# Patient Record
Sex: Male | Born: 1950
Health system: Southern US, Community
[De-identification: ages and names within clinical notes are randomized; demographics above are authoritative.]

## PROBLEM LIST (undated history)

## (undated) DIAGNOSIS — I493 Ventricular premature depolarization: Secondary | ICD-10-CM

## (undated) DIAGNOSIS — E785 Hyperlipidemia, unspecified: Secondary | ICD-10-CM

## (undated) DIAGNOSIS — F419 Anxiety disorder, unspecified: Secondary | ICD-10-CM

## (undated) DIAGNOSIS — I4891 Unspecified atrial fibrillation: Secondary | ICD-10-CM

## (undated) DIAGNOSIS — I10 Essential (primary) hypertension: Secondary | ICD-10-CM

## (undated) HISTORY — DX: Hyperlipidemia, unspecified: E78.5

## (undated) HISTORY — DX: Anxiety disorder, unspecified: F41.9

## (undated) HISTORY — DX: Essential (primary) hypertension: I10

## (undated) HISTORY — DX: Ventricular premature depolarization: I49.3

## (undated) HISTORY — DX: Unspecified atrial fibrillation: I48.91

## (undated) HISTORY — PX: OTHER SURGICAL HISTORY: SHX169

---

## 2003-05-05 ENCOUNTER — Encounter: Admission: RE | Admit: 2003-05-05 | Discharge: 2003-06-01 | Payer: Self-pay | Admitting: *Deleted

## 2006-07-29 HISTORY — PX: CARDIOVERSION: SHX1299

## 2007-01-30 ENCOUNTER — Inpatient Hospital Stay (HOSPITAL_COMMUNITY): Admission: EM | Admit: 2007-01-30 | Discharge: 2007-02-02 | Payer: Self-pay | Admitting: Emergency Medicine

## 2007-02-02 ENCOUNTER — Encounter: Payer: Self-pay | Admitting: Cardiovascular Disease

## 2010-03-23 ENCOUNTER — Ambulatory Visit: Payer: Self-pay | Admitting: Cardiovascular Disease

## 2010-05-14 ENCOUNTER — Ambulatory Visit: Payer: Self-pay | Admitting: Cardiology

## 2010-09-03 ENCOUNTER — Ambulatory Visit (INDEPENDENT_AMBULATORY_CARE_PROVIDER_SITE_OTHER): Payer: BC Managed Care – PPO | Admitting: Cardiovascular Disease

## 2010-09-03 ENCOUNTER — Encounter: Payer: Self-pay | Admitting: Cardiovascular Disease

## 2010-09-03 DIAGNOSIS — I4819 Other persistent atrial fibrillation: Secondary | ICD-10-CM | POA: Insufficient documentation

## 2010-09-03 DIAGNOSIS — F411 Generalized anxiety disorder: Secondary | ICD-10-CM

## 2010-09-03 DIAGNOSIS — I10 Essential (primary) hypertension: Secondary | ICD-10-CM

## 2010-09-03 DIAGNOSIS — E785 Hyperlipidemia, unspecified: Secondary | ICD-10-CM | POA: Insufficient documentation

## 2010-09-03 DIAGNOSIS — I493 Ventricular premature depolarization: Secondary | ICD-10-CM | POA: Insufficient documentation

## 2010-09-03 DIAGNOSIS — F419 Anxiety disorder, unspecified: Secondary | ICD-10-CM

## 2010-09-03 DIAGNOSIS — I4949 Other premature depolarization: Secondary | ICD-10-CM

## 2010-09-03 DIAGNOSIS — I4891 Unspecified atrial fibrillation: Secondary | ICD-10-CM

## 2010-09-03 MED ORDER — RAMIPRIL 10 MG PO CAPS: 10.0000 mg | ORAL_CAPSULE | Freq: Every day | ORAL | Status: DC

## 2010-09-03 MED ORDER — NEBIVOLOL HCL 10 MG PO TABS: 10.0000 mg | ORAL_TABLET | Freq: Every day | ORAL | Status: DC

## 2010-09-03 NOTE — Progress Notes (Signed)
Patient Active Problem List  Diagnoses  . HTN (hypertension)  . A-fib  . PVC (premature ventricular contraction)  . Hyperlipidemia  . Anxiety   See medication list, social history, family history and review of systems.  Gregory Hill is a middle-aged gentleman with the above-noted medical history. He presents today with several problems including hypertension and anxiety. His blood pressure has Been a little higher for the past several weeks. He notes that he is not sleeping very well. He is not eating any extra salt. He has taken an extra Altace each day of the weekend and has increased his HCTZ to 12.5 mg a day up from 12.5 mg every other day. He denies any chest pain, shortness of breath, syncope, or presyncope. He is walking about 1 mile a day. This is a little bit less than he typically walks.  On exam he is a middle-age gentleman in no acute distress. He is alert and oriented x3 and his mood and affect are normal. His blood pressure is 150/70. His heart rate is 72. His HEENT exam reveals normal sclera. His mucous membranes are moist. His carotids are 2+ and are without bruits. There is no thyromegaly. There is no JVD. His lungs are clear. Heart regular rate S1-S2. Abdominal exam reveals good bowel sounds and has no hepatosplenomegaly. There are no areas of tenderness. His extremities have no clubbing cyanosis or edema. There no palpable cords. His exam is normal. His gait is normal.  Gregory Hill presents with some mild hypertension. He is slightly anxious about this as well. At this point we will start him on Bystolic 5 mg a day. We will increase this to 10 mg a day after a week or 2. He is to discontinue the Toprol. We will have him increase his HCTZ  To 1/2 a tablet every day. We will increase his Altace to 10 mg a day. I'll see him back in the office in several weeks for followup office visit.  Check a basic metabolic profile today.  He has a history of intermittent atrial fibrillation in the past. This  is not been an active problem.  He does describe some palpitations which I suspect were premature ventricular contractions. We will need to keep a close eye on this and he may need to wear an event monitor.

## 2010-09-06 NOTE — Assessment & Plan Note (Signed)
[facility]                                ON-CALL NOTE  Gregory Hill, Gregory Hill                         MRN:          [id number] DATE:09/01/2010                            DOB:          05/16/1951   PRIMARY CARDIOLOGIST:  Vesta Mixer, M.D.  The patient called concerned about recently elevated blood pressure readings, in the 160-170 systolic range.  He states that he contacted Dr. Harvie Bridge office, and was instructed to double his dose of Altace, to 10 mg daily.  He denies any associated chest pain, shortness of breath, blurred vision, or headache.  His blood pressure earlier today was 162 systolic.  He was calling for additional instructions.  Of note, he is scheduled to see Dr. Elease Hashimoto in the clinic, this Monday.  PLAN:  I advised the patient to continue a course of careful monitoring for any development of symptoms, as outlined above.  I suggested that rather making additional adjustment at this point in time, given that he is scheduled to see Dr. Elease Hashimoto in 2 days, I will defer to Dr. Elease Hashimoto as to additional adjustments of his antihypertensive regimen.  The patient was agreeable with this recommendation, and knows to contact us again in the event of any worsening symptoms or worsening of blood pressure readings.     [name], PA-C      [name], MD, [facility]   GS/MedQ  DD: 09/01/2010  DT: 09/02/2010  Job #: [id number]

## 2010-09-19 ENCOUNTER — Encounter: Payer: Self-pay | Admitting: Cardiovascular Disease

## 2010-09-19 ENCOUNTER — Ambulatory Visit (INDEPENDENT_AMBULATORY_CARE_PROVIDER_SITE_OTHER): Payer: BC Managed Care – PPO | Admitting: Cardiovascular Disease

## 2010-09-19 DIAGNOSIS — I493 Ventricular premature depolarization: Secondary | ICD-10-CM

## 2010-09-19 DIAGNOSIS — I119 Hypertensive heart disease without heart failure: Secondary | ICD-10-CM

## 2010-09-19 DIAGNOSIS — I10 Essential (primary) hypertension: Secondary | ICD-10-CM

## 2010-09-19 NOTE — Assessment & Plan Note (Signed)
Gregory Hill still has occasional palpitations which I suspect her PVCs. I've reassured him that these are normal.

## 2010-09-19 NOTE — Progress Notes (Signed)
Patient Active Problem List  Diagnoses  . HTN (hypertension)  . A-fib  . PVC (premature ventricular contraction)  . Hyperlipidemia  . Anxiety    Current Outpatient Prescriptions on File Prior to Visit  Medication Sig Dispense Refill  . aspirin 81 MG tablet Take 81 mg by mouth daily.        . fenofibrate (TRICOR) 145 MG tablet Take 145 mg by mouth daily.        . hydrochlorothiazide 25 MG tablet Take 25 mg by mouth daily. 1/2 tablet a day       . niacin (NIASPAN) 1000 MG CR tablet Take 1,000 mg by mouth at bedtime. 1/2 tablet a day       . omega-3 acid ethyl esters (LOVAZA) 1 G capsule Take 2 g by mouth 2 (two) times daily.        . rosuvastatin (CRESTOR) 10 MG tablet Take 10 mg by mouth daily.        . nebivolol (BYSTOLIC) 10 MG tablet Take 1 tablet (10 mg total) by mouth daily.  30 tablet  11  . ramipril (ALTACE) 10 MG capsule Take 1 capsule (10 mg total) by mouth daily.  30 capsule  11   Gregory Hill presents today for followup of his hypertension. He has been feeling fairly well. He denies any chest pain or shortness of breath.  The patient is alert and oriented x 3.  The mood and affect are normal. The HEENT exam reveals that the sclera are nonicteric.  The mucous membranes are moist.  The carotids are 2+ without bruits.  There is no thyromegaly.  There is no JVD.  The lungs are clear.  The chest wall is non tender.  The heart exam reveals a regular rate with a normal S1 and S2.  There are no murmurs, gallops, or rubs.  The PMI is not displaced.   Abdominal exam reveals good bowel sounds.  There is no guarding or rebound.  There is no hepatosplenomegaly or tenderness.  There are no masses.  Exam of the legs reveal no clubbing, cyanosis, or edema.  The legs are without rashes.  The distal pulses are intact.  Cranial nerves II - XII are intact.  Motor and sensory functions are intact.  The gait is normal.  Gregory Hill is doing very well from a cardiac standpoint. I've reassured him that his blood  pressure and heart rate levels are normal. We will continue with the Altace 10 mg twice a day. I'll see him again in 3 months for followup visit.

## 2010-09-19 NOTE — Assessment & Plan Note (Signed)
His blood pressure is well controlled. We will increase his Altace to 10 mg twice a day which he actually already started taking. We will followup with him in 3 months.

## 2010-09-19 NOTE — Patient Instructions (Signed)
I'll see him again in 3 months.

## 2010-09-25 ENCOUNTER — Ambulatory Visit: Payer: Self-pay | Admitting: Cardiovascular Disease

## 2010-09-26 ENCOUNTER — Encounter: Payer: Self-pay | Admitting: Cardiovascular Disease

## 2010-09-26 NOTE — Progress Notes (Unsigned)
Patient Active Problem List  Diagnoses  . HTN (hypertension)  . A-fib  . PVC (premature ventricular contraction)  . Hyperlipidemia  . Anxiety    Current Outpatient Prescriptions on File Prior to Visit  Medication Sig Dispense Refill  . aspirin 81 MG tablet Take 81 mg by mouth daily.        . fenofibrate (TRICOR) 145 MG tablet Take 145 mg by mouth daily.        . hydrochlorothiazide 25 MG tablet Take 25 mg by mouth daily. 1/2 tablet a day       . nebivolol (BYSTOLIC) 10 MG tablet Take 1 tablet (10 mg total) by mouth daily.  30 tablet  11  . niacin (NIASPAN) 1000 MG CR tablet Take 1,000 mg by mouth at bedtime. 1/2 tablet a day       . omega-3 acid ethyl esters (LOVAZA) 1 G capsule Take 2 g by mouth 2 (two) times daily.        . ramipril (ALTACE) 10 MG capsule Take 1 capsule (10 mg total) by mouth daily.  30 capsule  11  . rosuvastatin (CRESTOR) 10 MG tablet Take 10 mg by mouth daily.         Gregory Hill presents today for followup of his hypertension. He has been feeling fairly well. He denies any chest pain or shortness of breath.  The patient is alert and oriented x 3.  The mood and affect are normal. The HEENT exam reveals that the sclera are nonicteric.  The mucous membranes are moist.  The carotids are 2+ without bruits.  There is no thyromegaly.  There is no JVD.  The lungs are clear.  The chest wall is non tender.  The heart exam reveals a regular rate with a normal S1 and S2.  There are no murmurs, gallops, or rubs.  The PMI is not displaced.   Abdominal exam reveals good bowel sounds.  There is no guarding or rebound.  There is no hepatosplenomegaly or tenderness.  There are no masses.  Exam of the legs reveal no clubbing, cyanosis, or edema.  The legs are without rashes.  The distal pulses are intact.  Cranial nerves II - XII are intact.  Motor and sensory functions are intact.  The gait is normal.  Gregory Hill is doing very well from a cardiac standpoint. I've reassured him that his blood  pressure and heart rate levels are normal. We will continue with the Altace 10 mg twice a day. I'll see him again in 3 months for followup visit.

## 2010-10-17 ENCOUNTER — Telehealth: Payer: Self-pay | Admitting: *Deleted

## 2010-10-17 DIAGNOSIS — I10 Essential (primary) hypertension: Secondary | ICD-10-CM

## 2010-10-17 MED ORDER — RAMIPRIL 10 MG PO CAPS: 10.0000 mg | ORAL_CAPSULE | Freq: Two times a day (BID) | ORAL | Status: DC

## 2010-10-17 NOTE — Telephone Encounter (Signed)
Called pt to reassess  his blood pressure.  States he is feeling well.   BP was noted 138/80.  Will continue to monitor.  He requested his Altace to be faxed eto [facility].  This was done.

## 2010-10-22 ENCOUNTER — Other Ambulatory Visit: Payer: Self-pay | Admitting: *Deleted

## 2010-10-22 DIAGNOSIS — I10 Essential (primary) hypertension: Secondary | ICD-10-CM

## 2010-10-22 MED ORDER — RAMIPRIL 10 MG PO CAPS: 10.0000 mg | ORAL_CAPSULE | Freq: Two times a day (BID) | ORAL | Status: DC

## 2010-10-22 MED ORDER — RAMIPRIL 10 MG PO CAPS: 10.0000 mg | ORAL_CAPSULE | Freq: Every day | ORAL | Status: DC

## 2010-10-22 NOTE — Telephone Encounter (Signed)
Pt requesting refill on  Altace

## 2010-11-15 ENCOUNTER — Ambulatory Visit (INDEPENDENT_AMBULATORY_CARE_PROVIDER_SITE_OTHER): Payer: BC Managed Care – PPO | Admitting: Cardiovascular Disease

## 2010-11-15 ENCOUNTER — Telehealth: Payer: Self-pay | Admitting: Cardiovascular Disease

## 2010-11-15 ENCOUNTER — Encounter: Payer: Self-pay | Admitting: *Deleted

## 2010-11-15 ENCOUNTER — Encounter: Payer: Self-pay | Admitting: Cardiovascular Disease

## 2010-11-15 VITALS — BP 120/70 | HR 56 | Wt 186.0 lb

## 2010-11-15 DIAGNOSIS — E785 Hyperlipidemia, unspecified: Secondary | ICD-10-CM

## 2010-11-15 DIAGNOSIS — I10 Essential (primary) hypertension: Secondary | ICD-10-CM

## 2010-11-15 NOTE — Telephone Encounter (Signed)
Pt calling with concerns of his HR being too low.  States it is ranging from 50-60;  States he feels dizzy in the head at times.  BP is 135/70;  Per Dr. Elease Hashimoto:  Hold the Metoprolol 12.5mg  daily and continue to monitor HR and BP;  Pt verbalized an understanding and will call back if he needs an appt.

## 2010-11-15 NOTE — Assessment & Plan Note (Signed)
His blood pressure is very well controlled this afternoon. It was a little bit elevated this morning. I've asked him to continue to hold his metoprolol for next 5 days. He will call back and let us know is doing. We will have him restart his Toprol at 12.5 mg a day if he has worsening PVCs were his blood pressure is higher.  I'll see him in 3 months.

## 2010-11-15 NOTE — Assessment & Plan Note (Signed)
He has a low HDL. I've tried to hurt him to take Niaspan 1000 mg a day instead of the 500 mg a day. He has trouble with flushing but he states that he'll try the higher dose again.  We'll check a fasting lipid profile and I see him again

## 2010-11-15 NOTE — Progress Notes (Signed)
Gregory Hill Date of Birth  [birth year] [facility] [address]     [address] [place], [place]  [address] [phone]  Fax  [phone]  History of Present Illness:  Gregory Hill is a middle-aged gentleman with a history of hypertension and intermittent atrial fibrillation. He also has a history of PVCs, dyslipidemia with a low HDL, and anxiety.  He presents today as a walk in visit for some episodes of hypertension. He also has been having some fatigue for the past week or so. He held his metoprolol this morning.  Current Outpatient Prescriptions on File Prior to Visit  Medication Sig Dispense Refill  . aspirin 81 MG tablet Take 81 mg by mouth daily.        . fenofibrate (TRICOR) 145 MG tablet Take 145 mg by mouth daily.        . hydrochlorothiazide 25 MG tablet Take 25 mg by mouth daily. 1/2 tablet a day       . niacin (NIASPAN) 1000 MG CR tablet Take 1,000 mg by mouth at bedtime. 1/2 tablet a day       . omega-3 acid ethyl esters (LOVAZA) 1 G capsule Take 2 g by mouth 2 (two) times daily.        . rosuvastatin (CRESTOR) 10 MG tablet Take 10 mg by mouth daily.        Marland Kitchen DISCONTD: ramipril (ALTACE) 10 MG capsule Take 1 capsule (10 mg total) by mouth daily.  30 capsule  11  . nebivolol (BYSTOLIC) 10 MG tablet Take 1 tablet (10 mg total) by mouth daily.  30 tablet  11  . ramipril (ALTACE) 10 MG capsule Take 1 capsule (10 mg total) by mouth 2 (two) times daily.  180 capsule  3   No Known Allergies  Past Medical History  Diagnosis Date  . Hypertension   . Hyperlipidemia   . Anxiety   . Arrhythmia     afib    No past surgical history on file.  History  Smoking status  . Never Smoker   Smokeless tobacco  . Not on file    History  Alcohol Use  . 1.5 - 2.5 oz/week  . 3-5 drink(s) per week    Family History  Problem Relation Age of Onset  . Heart disease Mother   . Heart attack Mother     Reviw of Systems:  Reviewed in the HPI.   All other systems are negative.  Physical Exam: BP 120/70  Pulse 56  Wt 186 lb (84.369 kg) The patient is alert and oriented x 3.  The mood and affect are normal.  The skin is warm and dry.  Color is normal.  The HEENT exam reveals that the sclera are nonicteric.  The mucous membranes are moist.  The carotids are 2+ without bruits.  There is no thyromegaly.  There is no JVD.  The lungs are clear.  The chest wall is non tender.  The heart exam reveals a regular rate with a normal S1 and S2.  There are no murmurs, gallops, or rubs.  The PMI is not displaced.   Abdominal exam reveals good bowel sounds.  There is no guarding or rebound.  There is no hepatosplenomegaly or tenderness.  There are no masses.  Exam of the legs reveal no clubbing, cyanosis, or edema.  The legs are without rashes.  The distal pulses are intact.  Cranial nerves II - XII are intact.  Motor  and sensory functions are intact.  The gait is normal.  Assessment / Plan:

## 2010-11-15 NOTE — Telephone Encounter (Signed)
RETURNING YOUR CALL, PLS CALL BACK

## 2010-11-27 ENCOUNTER — Other Ambulatory Visit: Payer: Self-pay | Admitting: Family Medicine

## 2010-11-27 ENCOUNTER — Ambulatory Visit
Admission: RE | Admit: 2010-11-27 | Discharge: 2010-11-27 | Disposition: A | Discharge status: Home / Self Care | Payer: BC Managed Care – PPO | Source: Ambulatory Visit | Attending: Family Medicine | Admitting: Family Medicine

## 2010-11-27 MED ORDER — IOHEXOL 300 MG/ML  SOLN
100.0000 mL | Freq: Once | INTRAMUSCULAR | Status: AC | PRN
  Administered 2010-11-27: 100 mL via INTRAVENOUS

## 2010-12-11 NOTE — Discharge Summary (Signed)
NAMEBONIFACE, GOFFE NO.:  [id number]   MEDICAL RECORD NO.:  [id number]          PATIENT TYPE:  INP   LOCATION:  2035                         FACILITY:  [facility]   PHYSICIAN:  Vesta Mixer, M.D. DATE OF BIRTH:  Sep 14, 1950   DATE OF ADMISSION:  01/30/2007  DATE OF DISCHARGE:  02/02/2007                               DISCHARGE SUMMARY   DISCHARGE DIAGNOSES:  1. Atrial fibrillation with a controlled ventricular response - status      post cardioversion.  2. Anticoagulation.  3. Profound vagal response leading to asystole, requiring a brief      period of CPR/  4. Dyslipidemia.  5. Hypertension.   DISCHARGE MEDICATIONS:  1. Coumadin 5 mg a day.  He will have his pro time checked in our      office on Wednesday.  2. Toprol-XL 50 mg a day.  3. Aspirin 81 mg a day.  4. TriCor 145 mg a day.  5. Niaspan 500 mg a day.  6. Omega-3 fatty acid 3 times a day.  7. Crestor 10 mg a day.  8. Prilosec 20 mg over-the-counter once a day.  9. Altace 5 mg a day.  10.HCTZ 12.5 mg 3 times a day.   DISPOSITION:  Patient will have his pro time checked on Wednesday.  He  will see Dr. Elease Hashimoto next week.   HISTORY:  Mr. Thorpe is a 60 year old gentleman with a history of mild  hypercholesterolemia and mild hypertension.  He felt atrial fibrillation  and presented to the emergency room.  Please see dictated H&P for  further details.   HOSPITAL COURSE:  1. Atrial fibrillation.  Patient was seen in the emergency room.  We      placed him on a low dose Cardizem drip.  He had an episode of      asystole following placement of an IV.  He received a short course      of CPR and came back without any neurologic deficits.  He ruled out      for myocardial infarction and had no other sequela.  He stays in      atrial fibrillation.  He was started on heparin and Coumadin.  On      February 02, 2007, he underwent a TEE, which revealed no evidence of      thrombus.  His left atrium and left  atrial appendage were without      clot.  He underwent a successful cardioversion with resumption of      sinus rhythm.  He now feels well and is not having any neurologic      deficits.  His INR today is 2.0.  We will send him on Coumadin 5 mg      today and we will recheck his pro time again the following day.  2. Dyslipidemia.  The patient will continue with the TriCor, Niaspan      and the Crestor.  3. Hypertension.  The patient will continue with the Altace and HCTZ.      He will also continue  on metoprolol XL for his blood pressure and      for his atrial fibrillation.  4. Other medical problems.  Stable and will be addressed as an      outpatient.           ______________________________  Vesta Mixer, M.D.     PJN/MEDQ  D:  02/02/2007  T:  02/02/2007  Job:  [id number]

## 2010-12-11 NOTE — H&P (Signed)
NAMEMarland Kitchen  TIMMY, BUBECK NO.:  [id number]   MEDICAL RECORD NO.:  [id number]          PATIENT TYPE:  EMS   LOCATION:  MAJO                         FACILITY:  [facility]   PHYSICIAN:  Vesta Mixer, M.D. DATE OF BIRTH:  10/22/1950   DATE OF ADMISSION:  01/30/2007  DATE OF DISCHARGE:                              HISTORY & PHYSICAL   Gregory Hill is a 60 year old gentleman with a history of  hyperlipidemia, hypertension.  He is admitted with new onset atrial  fibrillation.   Gregory Hill has been in relatively good health.  He has had a long history of  hypertension and mild hyperlipidemia.  These been very well-controlled.  He is a very active person.  He has a very strong family history of  early death in all the male members of his family.  In fact, no male  member of his family has lived past age 87.   He was down at the beach this past week and developed tachy  palpitations.  He has been taking extra doses of metoprolol.  This has  slowed his ventricular response but it has not converted him back to  normal.  He went to the EMS station and was found to have atrial  fibrillation.  He came home from the beach yesterday and now presents to  the emergency room.  He is still in atrial fibrillation.  He denies any  chest pain, shortness breath.  He does have palpitations and states that  he is more fatigued than usual.  He denies any PND or orthopnea.  He  denies any rash or fever or chills.   CURRENT MEDICATIONS:  1. Altace 5 mg a day.  2. Metoprolol 25 mg p.o. b.i.d.  3. Enteric-coated aspirin 325 mg a day.  4. Crestor 10 mg a day.  5. TriCor 145 mg a day.  6. Niaspan 500 mg a day.  7. Lovaza 1 gram a day.   ALLERGIES:  There are no known drug allergies.   PAST MEDICAL HISTORY:  1. Hypertension.  2. Hyperlipidemia.   SOCIAL HISTORY:  The patient does not smoke.  He drinks alcohol only  rarely.   FAMILY HISTORY:  Strongly positive for early cardiac death,  especially  in the male members of the family.   REVIEW OF SYSTEMS:  His review of systems is reviewed and is essentially  negative except as noted in the HPI.   PHYSICAL EXAMINATION:  GENERAL:  He is a middle aged gentleman in no  acute distress.  He is alert, oriented times 3.  His mood and affect are  normal.  VITAL SIGNS:  His blood pressure is 127/86 with a heart rate of 94  initially.  HEENT:  Examination reveals 2+ carotids.  He has no bruit, no JVD, no  thyromegaly.  LUNGS:  Clear to auscultation.  HEART:  Irregularly irregular.  ABDOMEN:  Examination reveals good bowel sounds and is nontender.  EXTREMITIES:  He has no clubbing, cyanosis or edema.  NEUROLOGIC:  Examination is nonfocal.   DATA REVIEWED:  His EKG reveals  atrial fibrillation with a controlled  ventricular response.  His laboratory data is pending.   ASSESSMENT:  Gregory Hill presents with new onset atrial fibrillation.  We will  try to convert him using IV Cardizem.  He tried multiple low doses of  p.o. metoprolol.  We will admit him to the hospital and start him on  heparin and Coumadin.  He is 60 years old so he should be at relatively  low risk for thromboembolus, although he does have multiple risk factors  and a family history.  He also is having some anxiety regarding this  atrial fibrillation.  We will give him some Xanax as needed.  We will  admit him for at least 23 hours to see how he does.           ______________________________  Vesta Mixer, M.D.     PJN/MEDQ  D:  01/30/2007  T:  01/30/2007  Job:  [id number]

## 2010-12-18 ENCOUNTER — Ambulatory Visit: Payer: BC Managed Care – PPO | Admitting: Cardiovascular Disease

## 2010-12-18 ENCOUNTER — Other Ambulatory Visit: Payer: BC Managed Care – PPO | Admitting: *Deleted

## 2011-01-17 ENCOUNTER — Other Ambulatory Visit: Payer: Self-pay | Admitting: *Deleted

## 2011-01-17 MED ORDER — NIACIN ER (ANTIHYPERLIPIDEMIC) 1000 MG PO TBCR: EXTENDED_RELEASE_TABLET | ORAL | Status: DC

## 2011-01-17 NOTE — Telephone Encounter (Signed)
Fax received from pharmacy. Refill completed. Jodette Jacoria Keiffer RN  

## 2011-01-21 ENCOUNTER — Telehealth: Payer: Self-pay | Admitting: Cardiovascular Disease

## 2011-01-21 NOTE — Telephone Encounter (Signed)
Pt called he has question about his meds please call

## 2011-01-21 NOTE — Telephone Encounter (Signed)
Dr consulted/pt can take aleve or anything else for back pain, pt informed.

## 2011-01-24 ENCOUNTER — Telehealth: Payer: Self-pay | Admitting: Cardiovascular Disease

## 2011-01-24 NOTE — Telephone Encounter (Signed)
Hurt his back and the Doctors at [facility] want to put him on Predmazone starting to day and he wanted to know if that would be ok. Please call back.

## 2011-01-24 NOTE — Telephone Encounter (Signed)
Pt not diabetic and isnt on any meds that would prohibit him from taking predisone, pt informed.

## 2011-02-01 ENCOUNTER — Telehealth: Payer: Self-pay | Admitting: Cardiovascular Disease

## 2011-02-01 ENCOUNTER — Other Ambulatory Visit: Payer: Self-pay | Admitting: *Deleted

## 2011-02-01 ENCOUNTER — Telehealth: Payer: Self-pay | Admitting: *Deleted

## 2011-02-01 NOTE — Telephone Encounter (Signed)
Called about his prescription of methocarbam and was concerned about it not mixing well with his blood pressure medication Altase. Please call back. I have pulled his chart.

## 2011-02-01 NOTE — Telephone Encounter (Signed)
Pt has new scripts and is concerned with reaction, spoke with pt and he spoke with the prescribing dr and with his pharmacist, the muscle relaxant may increase risk of hypotension and pt informed to get up slowly,  i informed dr Ian [place] of pts concerns and is aware of the risk of hypotension and to monitor s/s, stay hydrated. Pt informed and verbalized understanding.Alfonso Ramus RN

## 2011-02-01 NOTE — Telephone Encounter (Signed)
Wondered drug reaction between altace and meloxicam, dr Ian [place] consulted and he can use the med short term, pt to use for 30 days and then stop. Pt aware it is short term drug only. Alfonso Ramus RN

## 2011-02-12 ENCOUNTER — Other Ambulatory Visit: Payer: Self-pay | Admitting: Internal Medicine

## 2011-02-18 ENCOUNTER — Other Ambulatory Visit (INDEPENDENT_AMBULATORY_CARE_PROVIDER_SITE_OTHER): Payer: BC Managed Care – PPO | Admitting: *Deleted

## 2011-02-18 ENCOUNTER — Other Ambulatory Visit: Payer: BC Managed Care – PPO | Admitting: *Deleted

## 2011-02-18 DIAGNOSIS — R5383 Other fatigue: Secondary | ICD-10-CM

## 2011-02-18 DIAGNOSIS — R39198 Other difficulties with micturition: Secondary | ICD-10-CM

## 2011-02-18 DIAGNOSIS — E785 Hyperlipidemia, unspecified: Secondary | ICD-10-CM

## 2011-02-18 DIAGNOSIS — R5381 Other malaise: Secondary | ICD-10-CM

## 2011-02-18 LAB — BASIC METABOLIC PANEL
BUN: 16 mg/dL (ref 6–23)
CO2: 26 mEq/L (ref 19–32)
Calcium: 9.3 mg/dL (ref 8.4–10.5)
Creatinine, Ser: 1 mg/dL (ref 0.4–1.5)

## 2011-02-18 LAB — CBC WITH DIFFERENTIAL/PLATELET
Basophils Relative: 0.7 % (ref 0.0–3.0)
Eosinophils Relative: 2 % (ref 0.0–5.0)
Hemoglobin: 12.7 g/dL — ABNORMAL LOW (ref 13.0–17.0)
Lymphocytes Relative: 33.6 % (ref 12.0–46.0)
MCHC: 34 g/dL (ref 30.0–36.0)
Monocytes Relative: 11.1 % (ref 3.0–12.0)
Neutro Abs: 2.7 10*3/uL (ref 1.4–7.7)
RBC: 3.98 Mil/uL — ABNORMAL LOW (ref 4.22–5.81)
WBC: 5.1 10*3/uL (ref 4.5–10.5)

## 2011-02-18 LAB — HEPATIC FUNCTION PANEL
Albumin: 4.3 g/dL (ref 3.5–5.2)
Bilirubin, Direct: 0 mg/dL (ref 0.0–0.3)
Total Protein: 7.8 g/dL (ref 6.0–8.3)

## 2011-02-18 LAB — LIPID PANEL
HDL: 34.6 mg/dL — ABNORMAL LOW (ref >39.00)
Total CHOL/HDL Ratio: 3
Triglycerides: 97 mg/dL (ref 0.0–149.0)

## 2011-02-19 ENCOUNTER — Ambulatory Visit (INDEPENDENT_AMBULATORY_CARE_PROVIDER_SITE_OTHER): Payer: BC Managed Care – PPO | Admitting: Cardiovascular Disease

## 2011-02-19 ENCOUNTER — Encounter: Payer: Self-pay | Admitting: Cardiovascular Disease

## 2011-02-19 DIAGNOSIS — I10 Essential (primary) hypertension: Secondary | ICD-10-CM

## 2011-02-19 DIAGNOSIS — E785 Hyperlipidemia, unspecified: Secondary | ICD-10-CM

## 2011-02-19 NOTE — Progress Notes (Signed)
Gregory Hill Date of Birth  January 16, 1951 [place] [address]     [address] [place], [place]  [address] [phone]  Fax  [phone]  History of Present Illness:  Gregory Hill is a 60 year old gentleman with a history of atrial fibrillation, hypertension, and PVCs. Also has a history of hyperlipidemia and anxiety. He's done fairly well since I last saw him. He's not had any episodes of chest pain or shortness of breath. He has been having some problems with hematuria. He also injured his back it is not exercising as much as he would like.  He continues to have intermittent episodes of PACs and PVCs. He's not had any syncope or presyncope.    Current Outpatient Prescriptions on File Prior to Visit  Medication Sig Dispense Refill  . aspirin 81 MG tablet Take 81 mg by mouth daily.        . fenofibrate (TRICOR) 145 MG tablet Take 145 mg by mouth daily.        . hydrochlorothiazide 25 MG tablet Take 25 mg by mouth daily. 1/2 tablet a day       . niacin (NIASPAN) 1000 MG CR tablet One tablet daily  90 tablet  1  . omega-3 acid ethyl esters (LOVAZA) 1 G capsule Take 2 g by mouth 2 (two) times daily.        . ramipril (ALTACE) 10 MG capsule Take 1 capsule (10 mg total) by mouth 2 (two) times daily.  180 capsule  3  . rosuvastatin (CRESTOR) 10 MG tablet Take 10 mg by mouth daily.        Marland Kitchen DISCONTD: ramipril (ALTACE) 10 MG capsule Take 1 capsule (10 mg total) by mouth daily.  30 capsule  11    Allergies  Allergen Reactions  . Bystolic (Nebivolol Hcl)     Past Medical History  Diagnosis Date  . Hypertension   . Hyperlipidemia   . Anxiety   . Arrhythmia     afib    No past surgical history on file.  History  Smoking status  . Never Smoker   Smokeless tobacco  . Not on file    History  Alcohol Use  . 1.5 - 2.5 oz/week  . 3-5 drink(s) per week    Family History  Problem Relation Age of Onset  . Heart disease Mother   . Heart attack  Mother     Reviw of Systems:  Reviewed in the HPI.  All other systems are negative.  Physical Exam: BP 114/78  Pulse 62  Ht 6\' 1"  (1.854 m)  Wt 185 lb (83.915 kg)  BMI 24.41 kg/m2 The patient is alert and oriented x 3.  The mood and affect are normal.   Skin: warm and dry.  Color is normal.    HEENT:   the sclera are nonicteric.  The mucous membranes are moist.  The carotids are 2+ without bruits.  There is no thyromegaly.  There is no JVD.    Lungs: clear.  The chest wall is non tender.    Heart: regular rate with a normal S1 and S2.  There are no murmurs, gallops, or rubs. The PMI is not displaced.     Abdomen: good bowel sounds.  There is no guarding or rebound.  There is no hepatosplenomegaly or tenderness.  There are no masses.   Extremities:  no clubbing, cyanosis, or edema.  The legs are without rashes.  The distal pulses are intact.  Neuro:  Cranial nerves II - XII are intact.  Motor and sensory functions are intact.    The gait is normal.  Assessment / Plan:

## 2011-02-19 NOTE — Assessment & Plan Note (Signed)
His most recent lipid profile reveals a mildly depressed HDL. I've encouraged him to exercise. He'll continue with the Niaspan.

## 2011-02-19 NOTE — Assessment & Plan Note (Signed)
His blood pressure is well controlled. We stopped his metoprolol after his last visit and he seems to be doing quite well. He's had a few premature ventricular contractions but overall these are fairly benign.

## 2011-03-01 ENCOUNTER — Telehealth: Payer: Self-pay | Admitting: Cardiovascular Disease

## 2011-03-01 NOTE — Telephone Encounter (Signed)
Pt wants to know why we are now charging for the nurse to check blood pressures, explained it pulls rn away from the doctor and patients and the responsibility to provide care if too high/low. Pt advised to stop by pharmacy or [facility] and or get bp cuff and call in readings and we will advise free of charge. Pt agreed to do so.

## 2011-03-01 NOTE — Telephone Encounter (Signed)
Received call from patient, calling about BP check.  Said he would try you back at 2pm

## 2011-03-25 ENCOUNTER — Other Ambulatory Visit: Payer: Self-pay | Admitting: Cardiovascular Disease

## 2011-03-25 MED ORDER — OMEGA-3-ACID ETHYL ESTERS 1 G PO CAPS: 2.0000 g | ORAL_CAPSULE | Freq: Two times a day (BID) | ORAL | Status: DC

## 2011-04-12 ENCOUNTER — Telehealth: Payer: Self-pay | Admitting: *Deleted

## 2011-04-12 NOTE — Telephone Encounter (Signed)
Pt took one pill too many of his BP med/ altace last night, he is out of state and in a hotel room currently. Feels light headed, no nausea, no CP, no way to take bp and has to get on a plane in an hour and a half. Told to order room service and order large juice with ham or bacon. We will assume bp low. I will call back in 30 minutes to check on him. Dr Ian Bushman consulted and agreed.

## 2011-04-12 NOTE — Telephone Encounter (Signed)
Has eaten and is feeling a bit better, pt told to seek help if not better,Pt verbalized understanding. Alfonso Ramus RN

## 2011-05-14 LAB — HEPARIN LEVEL (UNFRACTIONATED)
Heparin Unfractionated: 0.66
Heparin Unfractionated: 0.76 — ABNORMAL HIGH
Heparin Unfractionated: 1.73 — ABNORMAL HIGH
Heparin Unfractionated: 1.75 — ABNORMAL HIGH

## 2011-05-14 LAB — COMPREHENSIVE METABOLIC PANEL
ALT: 25
AST: 27
CO2: 29
Calcium: 9.3
GFR calc Af Amer: >60
Sodium: 140
Total Protein: 6.7

## 2011-05-14 LAB — PROTIME-INR
INR: 1.1
INR: 1.3
INR: 2 — ABNORMAL HIGH
Prothrombin Time: 16.3 — ABNORMAL HIGH

## 2011-05-14 LAB — CBC
HCT: 37.7 — ABNORMAL LOW
HCT: 39.1
Hemoglobin: 13
MCHC: 34.2
MCV: 90.8
MCV: 91.3
MCV: 91.9
Platelets: 188
RBC: 4.25
RBC: 4.51
RDW: 13.4
RDW: 13.6
WBC: 4.7
WBC: 7.1

## 2011-05-14 LAB — DIFFERENTIAL
Eosinophils Absolute: 0.1
Eosinophils Relative: 2
Lymphs Abs: 1.7
Monocytes Relative: 7

## 2011-05-14 LAB — CK TOTAL AND CKMB (NOT AT ARMC)
CK, MB: 2.2
CK, MB: 2.9
Relative Index: 1.6
Relative Index: 1.8
Relative Index: 1.9
Total CK: 105
Total CK: 145

## 2011-05-14 LAB — APTT: aPTT: 28

## 2011-05-14 LAB — TROPONIN I: Troponin I: 0.01

## 2011-06-24 ENCOUNTER — Telehealth: Payer: Self-pay | Admitting: Cardiovascular Disease

## 2011-06-24 NOTE — Telephone Encounter (Signed)
New problem Pt is calling about heart murmur. Does he have one?

## 2011-06-24 NOTE — Telephone Encounter (Signed)
Last ov reviewed note, no murmer noted, pt informed

## 2011-07-02 ENCOUNTER — Telehealth: Payer: Self-pay | Admitting: Cardiovascular Disease

## 2011-07-02 NOTE — Telephone Encounter (Signed)
Pt is out of town on business and did not take enough meds with him and would like [place] to call a 5 day supply of lovaza 1000mg  bid called in [id number] phone # [phone]

## 2011-07-02 NOTE — Telephone Encounter (Signed)
Med called in, pt informed by msg.

## 2011-07-25 ENCOUNTER — Other Ambulatory Visit: Payer: Self-pay | Admitting: *Deleted

## 2011-07-25 MED ORDER — FENOFIBRATE 145 MG PO TABS: 145.0000 mg | ORAL_TABLET | Freq: Every day | ORAL | Status: DC

## 2011-07-25 MED ORDER — NIACIN ER (ANTIHYPERLIPIDEMIC) 1000 MG PO TBCR: EXTENDED_RELEASE_TABLET | ORAL | Status: DC

## 2011-08-01 ENCOUNTER — Telehealth: Payer: Self-pay | Admitting: Cardiovascular Disease

## 2011-08-01 NOTE — Telephone Encounter (Signed)
Generic is ok, told pt to call pharmacy to see price difference and either will be ok and we will leave it at his preference. Pt verbalized understanding.

## 2011-08-01 NOTE — Telephone Encounter (Signed)
Pt was given generic for tricor and he wants to make sure that is ok because he has been on tricor for years but never taken the generic

## 2011-08-20 ENCOUNTER — Telehealth: Payer: Self-pay | Admitting: Cardiovascular Disease

## 2011-08-20 ENCOUNTER — Other Ambulatory Visit: Payer: Self-pay | Admitting: *Deleted

## 2011-08-20 MED ORDER — PROPRANOLOL HCL 10 MG PO TABS: 10.0000 mg | ORAL_TABLET | Freq: Four times a day (QID) | ORAL | Status: DC | PRN

## 2011-08-20 NOTE — Telephone Encounter (Signed)
Pt heart is skipping beats more often and he wants to talk to someone about it

## 2011-08-20 NOTE — Telephone Encounter (Signed)
Pt c/o more freq skip beats for two weeks, occuring several times a day. He states he is not sleeping well/ more stress and is traveling. No caffeine intake per pt. Has had prior rx of propranolol 10 mg, new script sent with review of how to take, pt agreed to plan and will keep 09/03/11 app.

## 2011-08-26 NOTE — Telephone Encounter (Signed)
FU Call: pt calling wanting to speak with Dr. Elease Hashimoto regarding propanolol and how to properly use it. Pt also c/o palpitations this weekend and wanted to discuss that with MD. Please return pt call to discuss further.

## 2011-08-26 NOTE — Telephone Encounter (Signed)
Reinstructed pt on how to take inderal for palpitations. Pt wanted sooner appt if possible with Dr. Elease Hashimoto.  Opening found 9am on 1/31. Pt accepted appt.  Offered NP if needed sooner.  Reassurance given Mylo Red RN

## 2011-08-29 ENCOUNTER — Encounter: Payer: Self-pay | Admitting: Cardiovascular Disease

## 2011-08-29 ENCOUNTER — Ambulatory Visit (INDEPENDENT_AMBULATORY_CARE_PROVIDER_SITE_OTHER): Payer: BC Managed Care – PPO | Admitting: Cardiovascular Disease

## 2011-08-29 DIAGNOSIS — I4891 Unspecified atrial fibrillation: Secondary | ICD-10-CM

## 2011-08-29 DIAGNOSIS — F419 Anxiety disorder, unspecified: Secondary | ICD-10-CM

## 2011-08-29 DIAGNOSIS — R002 Palpitations: Secondary | ICD-10-CM

## 2011-08-29 DIAGNOSIS — I493 Ventricular premature depolarization: Secondary | ICD-10-CM

## 2011-08-29 DIAGNOSIS — F411 Generalized anxiety disorder: Secondary | ICD-10-CM

## 2011-08-29 DIAGNOSIS — I4949 Other premature depolarization: Secondary | ICD-10-CM

## 2011-08-29 DIAGNOSIS — I10 Essential (primary) hypertension: Secondary | ICD-10-CM

## 2011-08-29 DIAGNOSIS — E785 Hyperlipidemia, unspecified: Secondary | ICD-10-CM

## 2011-08-29 NOTE — Assessment & Plan Note (Signed)
[name]'s  blood pressure has been well-controlled. We'll continue with his current medications.

## 2011-08-29 NOTE — Progress Notes (Signed)
Gregory Hill Date of Birth  1951/02/18 [facility]  [address]    [address]   [address] [place], [place]  [address]    [place], [place]  [address] [phone]  Fax  [phone]  [phone]  Fax [phone]   History of Present Illness:  Gregory Hill is a 61 yo with a hx of palpitations, HTN, hyperlipidemia, transient atrial fibrillation.  He has a high stress job. He's noted episodes of tachycardia recently - as fast as 120 bmp.  He still walks regularly - 2-3 miles several times a week.  He has a strong family history of sudden cardiac death.  He has taken Metoprolol in the past but we stopped it due to bradycardia.  He's worn an event monitor several years ago which revealed normal sinus rhythm with occasional premature ventricular contractions.  Current Outpatient Prescriptions on File Prior to Visit  Medication Sig Dispense Refill  . aspirin 81 MG tablet Take 81 mg by mouth daily.        . cholecalciferol (VITAMIN D) 1000 UNITS tablet Take 1,000 Units by mouth daily.       . fenofibrate (TRICOR) 145 MG tablet Take 1 tablet (145 mg total) by mouth daily.  90 tablet  3  . hydrochlorothiazide 25 MG tablet Take 25 mg by mouth daily. 1/2 tablet a day       . Multiple Vitamin (MULTIVITAMIN) tablet Take 1 tablet by mouth daily.        . niacin (NIASPAN) 1000 MG CR tablet One tablet daily  90 tablet  3  . omega-3 acid ethyl esters (LOVAZA) 1 G capsule Take 2 capsules (2 g total) by mouth 2 (two) times daily.  360 capsule  1  . ramipril (ALTACE) 10 MG capsule Take 1 capsule (10 mg total) by mouth 2 (two) times daily.  180 capsule  3  . rosuvastatin (CRESTOR) 10 MG tablet Take 10 mg by mouth daily.        Marland Kitchen DISCONTD: ramipril (ALTACE) 10 MG capsule Take 1 capsule (10 mg total) by mouth daily.  30 capsule  11  propranolol 10 mg 4 times a day PRN   Allergies  Allergen Reactions  . Bystolic (Nebivolol Hcl)     Past Medical History  Diagnosis Date    . Hypertension   . Hyperlipidemia   . Anxiety   . Atrial fibrillation     No past surgical history on file.  History  Smoking status  . Never Smoker   Smokeless tobacco  . Not on file    History  Alcohol Use  . 1.5 - 2.5 oz/week  . 3-5 drink(s) per week    Family History  Problem Relation Age of Onset  . Heart disease Mother   . Heart attack Mother     Reviw of Systems:  Reviewed in the HPI.  All other systems are negative.  Physical Exam: Blood pressure 125/76, pulse 68, height 6\' 2"  (1.88 m), weight 186 lb (84.369 kg). General: Well developed, well nourished, in no acute distress.  Head: Normocephalic, atraumatic, sclera non-icteric, mucus membranes are moist  Neck: Supple. Negative for carotid bruits. JVD not elevated.  Lungs: Clear bilaterally to auscultation without wheezes, rales, or rhonchi. Breathing is unlabored.  Heart: RRR with S1 S2. No murmurs, rubs, or gallops appreciated.  Abdomen: Soft, non-tender, non-distended with normoactive bowel sounds. No hepatomegaly. No rebound/guarding. No obvious abdominal masses.  Msk:  Strength and tone appear normal for age.  Extremities: No clubbing or cyanosis. No edema.  Distal pedal pulses are 2+ and equal bilaterally.  Neuro: Alert and oriented X 3. Moves all extremities spontaneously.  Psych:  Responds to questions appropriately with a normal affect.    ECG: Normal sinus rhythm. He has a right axis deviation.  Assessment / Plan:

## 2011-08-29 NOTE — Patient Instructions (Signed)
Your physician wants you to follow-up in: 6 MONTHS  You will receive a reminder letter in the mail two months in advance. If you don't receive a letter, please call our office to schedule the follow-up appointment.  Your physician recommends that you return for a FASTING lipid profile: Tuesday AND IN 6 MONTHS

## 2011-08-29 NOTE — Assessment & Plan Note (Signed)
Gregory Hill continues to have lots of problems with premature ventricular contractions. We will have him take the propranolol on an as-needed basis. I told him that he can take up to 4 tablets a day. I do not think that these will cause a significant fatigue.

## 2011-09-03 ENCOUNTER — Ambulatory Visit: Payer: BC Managed Care – PPO | Admitting: Cardiovascular Disease

## 2011-09-03 ENCOUNTER — Other Ambulatory Visit (INDEPENDENT_AMBULATORY_CARE_PROVIDER_SITE_OTHER): Payer: BC Managed Care – PPO | Admitting: *Deleted

## 2011-09-03 DIAGNOSIS — R002 Palpitations: Secondary | ICD-10-CM

## 2011-09-03 DIAGNOSIS — E785 Hyperlipidemia, unspecified: Secondary | ICD-10-CM

## 2011-09-03 LAB — BASIC METABOLIC PANEL
BUN: 20 mg/dL (ref 6–23)
Chloride: 104 mEq/L (ref 96–112)
Glucose, Bld: 100 mg/dL — ABNORMAL HIGH (ref 70–99)
Potassium: 3.8 mEq/L (ref 3.5–5.1)
Sodium: 139 mEq/L (ref 135–145)

## 2011-09-03 LAB — HEPATIC FUNCTION PANEL
ALT: 21 U/L (ref 0–53)
AST: 24 U/L (ref 0–37)
Albumin: 4 g/dL (ref 3.5–5.2)
Total Protein: 7.1 g/dL (ref 6.0–8.3)

## 2011-09-03 LAB — LIPID PANEL: Cholesterol: 107 mg/dL (ref 0–200)

## 2011-09-05 ENCOUNTER — Ambulatory Visit: Payer: BC Managed Care – PPO | Admitting: Cardiovascular Disease

## 2011-10-14 ENCOUNTER — Other Ambulatory Visit: Payer: Self-pay | Admitting: Cardiovascular Disease

## 2011-10-14 DIAGNOSIS — I10 Essential (primary) hypertension: Secondary | ICD-10-CM

## 2011-10-14 MED ORDER — RAMIPRIL 10 MG PO CAPS: 10.0000 mg | ORAL_CAPSULE | Freq: Two times a day (BID) | ORAL | Status: DC

## 2011-10-14 MED ORDER — ROSUVASTATIN CALCIUM 10 MG PO TABS: 10.0000 mg | ORAL_TABLET | Freq: Every day | ORAL | Status: DC

## 2011-10-14 MED ORDER — HYDROCHLOROTHIAZIDE 25 MG PO TABS: 12.5000 mg | ORAL_TABLET | Freq: Every day | ORAL | Status: DC

## 2011-10-14 NOTE — Telephone Encounter (Signed)
Refill- Ramipril (ALTACE) 10 MG capsule          -Rosuvastatin (CRESTOR) 10 MG tablet          - hydrochlorothiazide 25 MG tablet  Patient request RX refill, verified preferred pharmacy as [facility] - [place], [place] - [address]   Patient can be reached at hm# [phone] should you have any additional questions.

## 2011-10-16 ENCOUNTER — Other Ambulatory Visit: Payer: Self-pay

## 2011-10-16 MED ORDER — OMEGA-3-ACID ETHYL ESTERS 1 G PO CAPS: 2.0000 g | ORAL_CAPSULE | Freq: Two times a day (BID) | ORAL | Status: DC

## 2011-11-28 ENCOUNTER — Telehealth: Payer: Self-pay | Admitting: Cardiovascular Disease

## 2011-11-28 NOTE — Telephone Encounter (Signed)
C/o stress/ getting ready to travel more/ palpitations increasing daily. Not taking inderal at all. Asked him to take inderal 10 mg now and if palpitations continue to take another. Pt is walking most days to relieve stress , suggested other ways to decrease stress and call back if problem continues, pt agreed to plan.

## 2011-11-28 NOTE — Telephone Encounter (Signed)
New msg Pt said he has been having more pvc's lately and wanted to talk to you

## 2011-12-05 ENCOUNTER — Telehealth: Payer: Self-pay | Admitting: Cardiovascular Disease

## 2011-12-05 NOTE — Telephone Encounter (Signed)
Pt calling re pvc's and suggestions nurse gave him, needs another call

## 2011-12-05 NOTE — Telephone Encounter (Signed)
Pt c/o more relief with propranolol use, but is only using 1-2 tablets daily. Pt told to stay on top of palpitation/ medication use and to get the 4 doses a day. Pt states he has palpitations 1-2 per hour. Please advise- pt willing to try low dose metoprolol 12.5 mg daily as done prior, do you need repeat TSH? Pt is on road and will need any rx sent to pharmacy of his choice- we will have to ask him, please send any advise to [facility] then select [facility] for [address] location. Thank you.

## 2011-12-06 ENCOUNTER — Encounter: Payer: Self-pay | Admitting: *Deleted

## 2011-12-06 MED ORDER — METOPROLOL TARTRATE 25 MG PO TABS: ORAL_TABLET | ORAL | Status: DC

## 2011-12-06 NOTE — Telephone Encounter (Signed)
Pt is still having palpitations that are better with propranolol but he is requiring several doses a day.  I think he would do better with metoprolol 12.5 mg BID.  He can take just QD if he gets dizzy.

## 2011-12-06 NOTE — Telephone Encounter (Signed)
PT AWARE TO TRY  METOPROLOL 12.5 MG  BID AND MAY DECREASE TO  QD IF DEVELOPS DIZZINESS  SCRIPT SENT TO  [facility]CY

## 2011-12-11 ENCOUNTER — Telehealth: Payer: Self-pay | Admitting: Cardiovascular Disease

## 2011-12-12 NOTE — Telephone Encounter (Signed)
error 

## 2011-12-13 ENCOUNTER — Ambulatory Visit (INDEPENDENT_AMBULATORY_CARE_PROVIDER_SITE_OTHER): Payer: BC Managed Care – PPO | Admitting: Cardiovascular Disease

## 2011-12-13 ENCOUNTER — Encounter: Payer: Self-pay | Admitting: Cardiovascular Disease

## 2011-12-13 VITALS — BP 127/70 | HR 67 | Ht 73.0 in | Wt 186.8 lb

## 2011-12-13 DIAGNOSIS — I4949 Other premature depolarization: Secondary | ICD-10-CM

## 2011-12-13 DIAGNOSIS — I493 Ventricular premature depolarization: Secondary | ICD-10-CM

## 2011-12-13 DIAGNOSIS — E785 Hyperlipidemia, unspecified: Secondary | ICD-10-CM

## 2011-12-13 MED ORDER — METOPROLOL TARTRATE 25 MG PO TABS: ORAL_TABLET | ORAL | Status: DC

## 2011-12-13 NOTE — Progress Notes (Signed)
Gregory Hill Date of Birth  Sep 21, 1950 [facility]  [address]    [address]   [address] [place], [place]  [address]    [place], [place]  [address] [phone]  Fax  [phone]  [phone]  Fax [phone]  Problem List: 1. Palpitations 2. Premature ventricular contractions 3. Paroxysmal atrial fibrillation 4. Dyslipidemia 5. Hypertension  History of Present Illness:  Gregory Hill is a 61 yo with a hx of palpitations, HTN, hyperlipidemia, transient atrial fibrillation.  He has a high stress job. He's noted episodes of tachycardia recently - as fast as 120 bmp.  He still walks regularly - 2-3 miles several times a week.  He has a strong family history of sudden cardiac death.  He's worn an event monitor several years ago which revealed normal sinus rhythm with occasional premature ventricular contractions.  He has had more frequent palpitations recently.  They have improved with propranolol and now is on Metoprolol 12.5 BID.  Current Outpatient Prescriptions on File Prior to Visit  Medication Sig Dispense Refill  . aspirin 81 MG tablet Take 81 mg by mouth daily.        . cholecalciferol (VITAMIN D) 1000 UNITS tablet Take 1,000 Units by mouth daily.       . fenofibrate (TRICOR) 145 MG tablet Take 1 tablet (145 mg total) by mouth daily.  90 tablet  3  . hydrochlorothiazide (HYDRODIURIL) 25 MG tablet Take 0.5 tablets (12.5 mg total) by mouth daily. 1/2 tablet a day  90 tablet  2  . metoprolol tartrate (LOPRESSOR) 25 MG tablet TAKE 1/2 TAB TWICE DAILY MAY  DECREASE TO  1/2 QD IF  DEVELOPS  DIZZINESS  90 tablet  3  . Multiple Vitamin (MULTIVITAMIN) tablet Take 1 tablet by mouth daily.        . niacin (NIASPAN) 1000 MG CR tablet One tablet daily  90 tablet  3  . omega-3 acid ethyl esters (LOVAZA) 1 G capsule Take 2 capsules (2 g total) by mouth 2 (two) times daily.  360 capsule  1  . ramipril (ALTACE) 10 MG capsule Take 1 capsule (10 mg total) by  mouth 2 (two) times daily.  180 capsule  3  . rosuvastatin (CRESTOR) 10 MG tablet Take 1 tablet (10 mg total) by mouth daily.  90 tablet  2  propranolol 10 mg 4 times a day PRN   Allergies  Allergen Reactions  . Bystolic (Nebivolol Hcl)     Past Medical History  Diagnosis Date  . Hypertension   . Hyperlipidemia   . Anxiety   . Atrial fibrillation     No past surgical history on file.  History  Smoking status  . Never Smoker   Smokeless tobacco  . Not on file    History  Alcohol Use  . 1.5 - 2.5 oz/week  . 3-5 drink(s) per week    Family History  Problem Relation Age of Onset  . Heart disease Mother   . Heart attack Mother     Reviw of Systems:  Reviewed in the HPI.  All other systems are negative.  Physical Exam: Blood pressure 127/70, pulse 67, height 6\' 1"  (1.854 m), weight 186 lb 12.8 oz (84.732 kg). General: Well developed, well nourished, in no acute distress.  Head: Normocephalic, atraumatic, sclera non-icteric, mucus membranes are moist  Neck: Supple. Negative for carotid bruits. JVD not elevated.  Lungs: Clear bilaterally to auscultation without wheezes, rales,  or rhonchi. Breathing is unlabored.  Heart: RRR with S1 S2. No murmurs, rubs, or gallops appreciated.  Abdomen: Soft, non-tender, non-distended with normoactive bowel sounds. No hepatomegaly. No rebound/guarding. No obvious abdominal masses.  Msk:  Strength and tone appear normal for age.  Extremities: No clubbing or cyanosis. No edema.  Distal pedal pulses are 2+ and equal bilaterally.  Neuro: Alert and oriented X 3. Moves all extremities spontaneously.  Psych:  Responds to questions appropriately with a normal affect.    ECG: Normal sinus rhythm. He has a right axis deviation.  Assessment / Plan:

## 2011-12-13 NOTE — Assessment & Plan Note (Signed)
We'll check fasting lipids at his next visit

## 2011-12-13 NOTE — Assessment & Plan Note (Addendum)
His rhythm has been well controlled on the low dose Metoprolol. We will see him again in 6 months for office visit. He is to continue the same medications.  We'll have him continue to use the  Propranolol on as-needed basis. He'll return to see [place] in 6 months.

## 2011-12-13 NOTE — Patient Instructions (Addendum)
Your physician wants you to follow-up in: 6 months  You will receive a reminder letter in the mail two months in advance. If you don't receive a letter, please call our office to schedule the follow-up appointment.   Your physician recommends that you continue on your current medications as directed. Please refer to the Current Medication list given to you today.   Your physician recommends that you return for a FASTING lipid profile: 6 months   

## 2011-12-22 ENCOUNTER — Encounter: Payer: Self-pay | Admitting: *Deleted

## 2012-01-20 ENCOUNTER — Ambulatory Visit (INDEPENDENT_AMBULATORY_CARE_PROVIDER_SITE_OTHER): Payer: BC Managed Care – PPO | Admitting: *Deleted

## 2012-01-20 ENCOUNTER — Telehealth: Payer: Self-pay | Admitting: Cardiovascular Disease

## 2012-01-20 VITALS — BP 122/72 | HR 69 | Resp 18 | Ht 73.0 in | Wt 187.5 lb

## 2012-01-20 DIAGNOSIS — I4949 Other premature depolarization: Secondary | ICD-10-CM

## 2012-01-20 DIAGNOSIS — I493 Ventricular premature depolarization: Secondary | ICD-10-CM

## 2012-01-20 MED ORDER — METOPROLOL TARTRATE 25 MG PO TABS: ORAL_TABLET | ORAL | Status: DC

## 2012-01-20 NOTE — Telephone Encounter (Signed)
Please return call to patient at [phone] regarding medication .

## 2012-01-20 NOTE — Progress Notes (Signed)
Patient in for an EKG for C/O of PVC's B/P 122/72, EKG done per nurse, read per Dr. Elease Hashimoto MD. HR 69 beats/minute, Normal sinus rhythm. Patient's medication dose  Lopressor  was increased slightly per MD.  Prescription was send to [facility], per Mahalia Longest RN. Patient aware of change. He verbalized understanding.

## 2012-01-20 NOTE — Telephone Encounter (Signed)
C/O PVC'S PLUS A DIFFERENT HEART BEAT THAT FEELS DIFFERENT- HARD THUMP, DENIES CP,SOB, NAUSEA, DR NAHSER INCREASED METOPROLOL PT STATES BP BEEN GOOD NOT LOW CANT REMEMBER NUMBERS// PULSE FROM 60-90. PT COMING IN FOR EKG/ MEDS CHANGED.

## 2012-01-23 ENCOUNTER — Telehealth: Payer: Self-pay | Admitting: Cardiovascular Disease

## 2012-01-23 NOTE — Telephone Encounter (Signed)
New msg Pt is taking metoprolol and his pvcs are still there but his pulse is 52

## 2012-01-23 NOTE — Telephone Encounter (Signed)
Dr Elease Hashimoto consulted, continue meds since he feels ok with pulse of 52, told him if he feels dizzy or not well to check pulse and if low to hold that dose and take next dose at reg time if feeling ok, pt agreed to plan.

## 2012-01-28 ENCOUNTER — Telehealth: Payer: Self-pay | Admitting: Cardiovascular Disease

## 2012-01-28 NOTE — Telephone Encounter (Signed)
Spoke with pt, he is on an increased dosage of metoprolol due to PVC's. There has really only been a small change since increasing the dosage, he reports they do not disrupt his thinking as much. He wonders if this will be normal for him going forward or if the PVC's will improve and go away. He also wonders about being tired and the ability to decrease the metoprolol if needed. He is more sensitive to heat and he feels the PVC's are causing anxiety. He was given the okay to use benadryl at night to help with sleeping. Will forward for dr nahser's review.

## 2012-01-28 NOTE — Telephone Encounter (Signed)
Please return call to patient at [phone] regarding medication changes

## 2012-01-29 ENCOUNTER — Telehealth: Payer: Self-pay | Admitting: Cardiovascular Disease

## 2012-01-29 NOTE — Telephone Encounter (Signed)
PT HAVING INCREASE IN PVC'S SO TAKING INCREASE OF METOPROLOL TO 25 MG IN THE AM AND 25MG  IN THE PM, THEN IS IT OK FOR HIM TO TAKE HIS ANXIETY MED, ALPRAZOLAM .5MG 

## 2012-01-29 NOTE — Telephone Encounter (Signed)
I spoke with the pt and made him aware that he can take alprazolam as needed for anxiety.  The pt feels like PVC's are causing him increased anxiety.  The pt also said that he may decrease his dose of metoprolol back to previous dose due to fatigue.  I made the pt aware that his PVC's may worsen on decreased dose.  The pt will try to stay on current dose of Metoprolol at this time.

## 2012-02-17 ENCOUNTER — Telehealth: Payer: Self-pay | Admitting: Cardiovascular Disease

## 2012-02-17 NOTE — Telephone Encounter (Signed)
Talked to [place],  I suspect these PVCs .  His potassium was 3.8 in February.  I suspect it is lower now.  Asked him to get cans of V-8 and try drinking a V-8 each day.  He also mentioned that his HR is staying a bit faster.  The last TSH that I can find is from 2008.  We will want to repeat his BMP and a TSH at his next OV or sooner if his symptoms do not resolve.

## 2012-02-17 NOTE — Telephone Encounter (Signed)
Patient request return call from nurse, he can be reached at [phone]

## 2012-02-17 NOTE — Telephone Encounter (Signed)
Patient called stated he is having frequent pvc's.Stated he was told these are benign, but it worries him.States they are worse at night and he wakes up a couple times at night and notices heart skipping.States is taking metoprolol 25 mg twice a day and dont seem to be helping.States he is not drinking caffeine and not taking any stimulants to cause.Patient wants to know what to do.Message sent to Dr.Nahser for advice.

## 2012-02-17 NOTE — Telephone Encounter (Signed)
Just wants to talk to Dr Elease Hashimoto

## 2012-03-23 ENCOUNTER — Encounter: Payer: Self-pay | Admitting: Cardiovascular Disease

## 2012-03-23 ENCOUNTER — Ambulatory Visit (INDEPENDENT_AMBULATORY_CARE_PROVIDER_SITE_OTHER): Payer: BC Managed Care – PPO | Admitting: Cardiovascular Disease

## 2012-03-23 VITALS — BP 113/54 | HR 62 | Ht 73.5 in | Wt 188.0 lb

## 2012-03-23 DIAGNOSIS — I4949 Other premature depolarization: Secondary | ICD-10-CM

## 2012-03-23 DIAGNOSIS — R002 Palpitations: Secondary | ICD-10-CM

## 2012-03-23 DIAGNOSIS — I493 Ventricular premature depolarization: Secondary | ICD-10-CM

## 2012-03-23 LAB — BASIC METABOLIC PANEL WITH GFR
BUN: 19 mg/dL (ref 6–23)
CO2: 30 meq/L (ref 19–32)
Calcium: 9.7 mg/dL (ref 8.4–10.5)
Chloride: 100 meq/L (ref 96–112)
Creatinine, Ser: 1.2 mg/dL (ref 0.4–1.5)
GFR: 64.95 mL/min
Glucose, Bld: 92 mg/dL (ref 70–99)
Potassium: 3.5 meq/L (ref 3.5–5.1)
Sodium: 137 meq/L (ref 135–145)

## 2012-03-23 NOTE — Progress Notes (Signed)
[name] Date of Birth  Apr 11, 1951 [facility]  [address]    [address]   [address] [place], [place]  [address]    [place], [place]  [address] [phone]  Fax  [phone]  [phone]  Fax [phone]  Problem List: 1. Palpitations 2. Premature ventricular contractions 3. Paroxysmal atrial fibrillation 4. Dyslipidemia 5. Hypertension  History of Present Illness:  [name] is a 61 yo with a hx of palpitations, HTN, hyperlipidemia, transient atrial fibrillation.  He has a high stress job. He's noted episodes of tachycardia recently - as fast as 120 bmp.  He still walks regularly - 2-3 miles several times a week.  He has a strong family history of sudden cardiac death.  He's worn an event monitor several years ago which revealed normal sinus rhythm with occasional premature ventricular contractions.  His palpitations have improved some over the past several months. They have improved with propranolol and now is on Metoprolol 12.5 BID.  Current Outpatient Prescriptions on File Prior to Visit  Medication Sig Dispense Refill  . aspirin 81 MG tablet Take 81 mg by mouth daily.        . cholecalciferol (VITAMIN D) 1000 UNITS tablet Take 1,000 Units by mouth daily.       . fenofibrate (TRICOR) 145 MG tablet Take 1 tablet (145 mg total) by mouth daily.  90 tablet  3  . hydrochlorothiazide (HYDRODIURIL) 25 MG tablet Take 0.5 tablets (12.5 mg total) by mouth daily. 1/2 tablet a day  90 tablet  2  . metoprolol tartrate (LOPRESSOR) 25 MG tablet TAKE ONE  TAB TWICE DAILY  CALL IF YOU DEVELOP  DIZZINESS  180 tablet  3  . Multiple Vitamin (MULTIVITAMIN) tablet Take 1 tablet by mouth daily.        . niacin (NIASPAN) 1000 MG CR tablet One tablet daily  90 tablet  3  . omega-3 acid ethyl esters (LOVAZA) 1 G capsule Take 2 capsules (2 g total) by mouth 2 (two) times daily.  360 capsule  1  . ramipril (ALTACE) 10 MG capsule Take 1 capsule (10 mg total) by  mouth 2 (two) times daily.  180 capsule  3  . rosuvastatin (CRESTOR) 10 MG tablet Take 1 tablet (10 mg total) by mouth daily.  90 tablet  2  propranolol 10 mg 4 times a day PRN   Allergies  Allergen Reactions  . Bystolic (Nebivolol Hcl)     Past Medical History  Diagnosis Date  . Hypertension   . Hyperlipidemia   . Anxiety   . Atrial fibrillation   . PVC (premature ventricular contraction)     Past Surgical History  Procedure Date  . None     History  Smoking status  . Never Smoker   Smokeless tobacco  . Never Used    History  Alcohol Use  . 1.5 - 2.5 oz/week  . 3-5 drink(s) per week    Family History  Problem Relation Age of Onset  . Heart disease Mother   . Heart attack Mother     Reviw of Systems:  Reviewed in the HPI.  All other systems are negative.  Physical Exam: Blood pressure 113/54, pulse 62, height 6' 1.5" (1.867 m), weight 188 lb (85.276 kg), SpO2 97.00%. General: Well developed, well nourished, in no acute distress.  Head: Normocephalic, atraumatic, sclera non-icteric, mucus membranes are moist  Neck: Supple. Negative for carotid bruits. JVD not  elevated.  Lungs: Clear bilaterally to auscultation without wheezes, rales, or rhonchi. Breathing is normal.  Heart: RRR with S1 S2. No murmurs, rubs, or gallops appreciated.  Abdomen: Soft, non-tender, non-distended with normoactive bowel sounds. No hepatomegaly. No rebound/guarding. No obvious abdominal masses.  Msk:  Strength and tone appear normal for age.  Extremities: No clubbing or cyanosis. No edema.  Distal pedal pulses are 2+ and equal bilaterally.  Neuro: Alert and oriented X 3. Moves all extremities spontaneously.  Psych:  Responds to questions appropriately with a normal affect.    ECG:   Assessment / Plan:

## 2012-03-23 NOTE — Patient Instructions (Signed)
Your physician recommends that you return for lab work in: TODAY/ TSH, BMET  Your physician has recommended you make the following change in your medication:   STOP HCTZ  Your physician recommends that you schedule a follow-up appointment in: 3 MONTHS

## 2012-03-23 NOTE — Assessment & Plan Note (Addendum)
Gregory Hill is doing well. He's not had any episodes of serious arrhythmias. He has occasional palpitations but these seemed to be improving.  His BP is on the low side.   I would like to stop his hydrochlorothiazide. This may also benefit his PVCs.

## 2012-04-20 ENCOUNTER — Telehealth: Payer: Self-pay | Admitting: Cardiovascular Disease

## 2012-04-20 NOTE — Telephone Encounter (Signed)
Pt was away over weekend and all went well with heart rhythm but today he ran a 5-8 sec period with a very fast rate, pt feeling fine now, denies caffeine intake, pt has been taking metoprolol as prescribed, told pt I will forward note to Dr Elease Hashimoto, continue to monitor symtoms and call with any further R/R problems, pt agreed to plan.

## 2012-04-20 NOTE — Telephone Encounter (Signed)
Pt had rapid heartbeat for about 6-7 sec then went back into rhythm, wanted to talk to jodette about this

## 2012-04-23 NOTE — Telephone Encounter (Signed)
No changes.  He may take propranolol if he has a sustained episode of tachycardia

## 2012-05-11 ENCOUNTER — Telehealth: Payer: Self-pay | Admitting: Cardiovascular Disease

## 2012-05-11 MED ORDER — METOPROLOL TARTRATE 25 MG PO TABS: 12.5000 mg | ORAL_TABLET | Freq: Two times a day (BID) | ORAL | Status: DC

## 2012-05-11 NOTE — Telephone Encounter (Signed)
Pt reduced metoprolol due to fatigue, MAR adjusted

## 2012-05-11 NOTE — Telephone Encounter (Signed)
plz return call to pt [phone] regarding medication dosage, pt has reduced the dosage on his own.

## 2012-06-30 ENCOUNTER — Other Ambulatory Visit: Payer: Self-pay | Admitting: *Deleted

## 2012-06-30 ENCOUNTER — Ambulatory Visit: Payer: BC Managed Care – PPO | Admitting: Cardiovascular Disease

## 2012-06-30 DIAGNOSIS — I10 Essential (primary) hypertension: Secondary | ICD-10-CM

## 2012-06-30 MED ORDER — FENOFIBRATE 145 MG PO TABS: 145.0000 mg | ORAL_TABLET | Freq: Every day | ORAL | Status: DC

## 2012-06-30 MED ORDER — NIACIN ER (ANTIHYPERLIPIDEMIC) 1000 MG PO TBCR: EXTENDED_RELEASE_TABLET | ORAL | Status: DC

## 2012-06-30 MED ORDER — RAMIPRIL 10 MG PO CAPS: 10.0000 mg | ORAL_CAPSULE | Freq: Two times a day (BID) | ORAL | Status: DC

## 2012-06-30 MED ORDER — ROSUVASTATIN CALCIUM 10 MG PO TABS: 10.0000 mg | ORAL_TABLET | Freq: Every day | ORAL | Status: DC

## 2012-06-30 NOTE — Telephone Encounter (Signed)
Fax Received. Refill Completed. Gregory Hill (R.M.A)   

## 2012-08-25 ENCOUNTER — Telehealth: Payer: Self-pay | Admitting: Cardiovascular Disease

## 2012-08-25 NOTE — Telephone Encounter (Signed)
pt calling re niacin, has questions

## 2012-08-25 NOTE — Telephone Encounter (Signed)
lmtcb

## 2012-08-25 NOTE — Telephone Encounter (Signed)
App made/ pt had canceled last app, pt has no current needs- f/u app set, pt asked about niaspan vs niacin. I believe pharmacy is out of niaspan but to ask pharmacy. Pt was accepting of plan.

## 2012-09-21 ENCOUNTER — Encounter: Payer: Self-pay | Admitting: Cardiovascular Disease

## 2012-09-29 ENCOUNTER — Ambulatory Visit: Payer: BC Managed Care – PPO | Admitting: Cardiovascular Disease

## 2012-10-06 ENCOUNTER — Ambulatory Visit (INDEPENDENT_AMBULATORY_CARE_PROVIDER_SITE_OTHER): Payer: BC Managed Care – PPO | Admitting: Cardiovascular Disease

## 2012-10-06 ENCOUNTER — Encounter: Payer: Self-pay | Admitting: Cardiovascular Disease

## 2012-10-06 VITALS — BP 120/70 | HR 63 | Ht 72.0 in | Wt 188.0 lb

## 2012-10-06 DIAGNOSIS — I4891 Unspecified atrial fibrillation: Secondary | ICD-10-CM

## 2012-10-06 DIAGNOSIS — R002 Palpitations: Secondary | ICD-10-CM

## 2012-10-06 DIAGNOSIS — I10 Essential (primary) hypertension: Secondary | ICD-10-CM

## 2012-10-06 MED ORDER — ALPRAZOLAM 0.5 MG PO TABS: 0.5000 mg | ORAL_TABLET | Freq: Every evening | ORAL | Status: DC | PRN

## 2012-10-06 MED ORDER — ALPRAZOLAM 0.25 MG PO TABS: 0.2500 mg | ORAL_TABLET | Freq: Every evening | ORAL | Status: DC | PRN

## 2012-10-06 MED ORDER — PROPRANOLOL HCL 10 MG PO TABS: 10.0000 mg | ORAL_TABLET | ORAL | Status: DC | PRN

## 2012-10-06 NOTE — Assessment & Plan Note (Signed)
We will check fasting lipids in 2 weeks. He'll continue his current medications.

## 2012-10-06 NOTE — Patient Instructions (Addendum)
Your physician recommends that you return for a FASTING lipid profile: 2 weeks (bmet, lipids, hfp)  Your physician wants you to follow-up in: 1 YEAR You will receive a reminder letter in the mail two months in advance. If you don't receive a letter, please call our office to schedule the follow-up appointment.  Your physician recommends that you return for a FASTING lipid profile: 1 YEAR  Your physician recommends that you continue on your current medications as directed. Please refer to the Current Medication list given to you today.

## 2012-10-06 NOTE — Assessment & Plan Note (Signed)
Gregory Hill is doing well.  He is ambulating without problems.  Will continue current medications.  I will see him in 1 year.

## 2012-10-06 NOTE — Progress Notes (Signed)
Gregory Hill Date of Birth  04-Sep-1950 [facility] Office  [address]    [address]   [address] [place], [place]  [address]    [place], [place]  [address] [phone]  Fax  [phone]  [phone]  Fax [phone]  Problem List: 1. Palpitations 2. Premature ventricular contractions 3. Paroxysmal atrial fibrillation 4. Dyslipidemia 5. Hypertension  History of Present Illness:  Gregory Hill is a 62 yo with a hx of palpitations, HTN, hyperlipidemia, transient atrial fibrillation.  He has a high stress job. He's noted episodes of tachycardia recently - as fast as 120 bmp.  He still walks regularly - 2-3 miles several times a week.  He has a strong family history of sudden cardiac death.  He's worn an event monitor several years ago which revealed normal sinus rhythm with occasional premature ventricular contractions.  His palpitations have improved some over the past several months. They have improved with propranolol and now is on Metoprolol 12.5 BID.  October 06, 2012:  He is doing well.  Still having some palpitations.  He is still walking regularly.   He is staying hydrated.  He is not having any problems with his palpitations.   Current Outpatient Prescriptions on File Prior to Visit  Medication Sig Dispense Refill  . aspirin 81 MG tablet Take 81 mg by mouth daily.        . cholecalciferol (VITAMIN D) 1000 UNITS tablet Take 1,000 Units by mouth daily.       . fenofibrate (TRICOR) 145 MG tablet Take 1 tablet (145 mg total) by mouth daily.  90 tablet  3  . metoprolol tartrate (LOPRESSOR) 25 MG tablet Take 0.5 tablets (12.5 mg total) by mouth 2 (two) times daily. TAKE ONE  TAB TWICE DAILY  CALL IF YOU DEVELOP  DIZZINESS  180 tablet  3  . Multiple Vitamin (MULTIVITAMIN) tablet Take 1 tablet by mouth daily.        . niacin (NIASPAN) 1000 MG CR tablet One tablet daily  90 tablet  3  . ramipril (ALTACE) 10 MG capsule Take 1 capsule (10 mg total) by mouth 2  (two) times daily.  180 capsule  3  . rosuvastatin (CRESTOR) 10 MG tablet Take 1 tablet (10 mg total) by mouth daily.  90 tablet  2   No current facility-administered medications on file prior to visit.  propranolol 10 mg 4 times a day PRN   Allergies  Allergen Reactions  . Bystolic (Nebivolol Hcl)     Past Medical History  Diagnosis Date  . Hypertension   . Hyperlipidemia   . Anxiety   . Atrial fibrillation   . PVC (premature ventricular contraction)     Past Surgical History  Procedure Laterality Date  . None      History  Smoking status  . Never Smoker   Smokeless tobacco  . Never Used    History  Alcohol Use  . 1.5 - 2.5 oz/week  . 3-5 drink(s) per week    Family History  Problem Relation Age of Onset  . Heart disease Mother   . Heart attack Mother     Reviw of Systems:  Reviewed in the HPI.  All other systems are negative.  Physical Exam: Blood pressure 120/70, pulse 63, height 6' (1.829 m), weight 188 lb (85.276 kg). General: Well developed, well nourished, in no acute distress.  Head: Normocephalic, atraumatic, sclera non-icteric, mucus membranes are moist  Neck:  Supple. Negative for carotid bruits. JVD not elevated.  Lungs: Clear bilaterally to auscultation without wheezes, rales, or rhonchi. Breathing is normal.  Heart: RRR with S1 S2. No murmurs, rubs, or gallops appreciated.  Abdomen: Soft, non-tender, non-distended with normoactive bowel sounds. No hepatomegaly. No rebound/guarding. No obvious abdominal masses.  Msk:  Strength and tone appear normal for age.  Extremities: No clubbing or cyanosis. No edema.  Distal pedal pulses are 2+ and equal bilaterally.  Neuro: Alert and oriented X 3. Moves all extremities spontaneously.  Psych:  Responds to questions appropriately with a normal affect.    ECG:  October 06, 2012:  NSR at 55  Assessment / Plan:

## 2012-10-12 ENCOUNTER — Telehealth: Payer: Self-pay | Admitting: *Deleted

## 2012-10-12 MED ORDER — PROPRANOLOL HCL 10 MG PO TABS: 10.0000 mg | ORAL_TABLET | ORAL | Status: DC | PRN

## 2012-10-12 NOTE — Telephone Encounter (Signed)
Pt called wondering if we sent script in for propranolol, i reviewed chart and informed him by MSG that dr refilled it on last ov to his mail order pharmacy, i sent to local [facility] and told him to call back if wants it sent elsewhere.

## 2012-10-20 ENCOUNTER — Other Ambulatory Visit: Payer: BC Managed Care – PPO

## 2012-12-11 ENCOUNTER — Other Ambulatory Visit: Payer: Self-pay | Admitting: *Deleted

## 2012-12-11 ENCOUNTER — Telehealth: Payer: Self-pay | Admitting: Cardiovascular Disease

## 2012-12-11 MED ORDER — OMEGA-3-ACID ETHYL ESTERS 1 G PO CAPS: 3.0000 g | ORAL_CAPSULE | Freq: Every day | ORAL | Status: DC

## 2012-12-11 NOTE — Telephone Encounter (Signed)
Gregory Hill may hold his metoprolol for now.  Have him call us back next week and let us know how he is doing.

## 2012-12-11 NOTE — Telephone Encounter (Signed)
Patient informed that Dr. Elease Hashimoto said that he can hold his Metoprolol for now and to call us next week, about Wednesday to let us know how he is doing.  Patient verbalized understanding.

## 2012-12-11 NOTE — Telephone Encounter (Signed)
New problem   Pt's pulse rate is dropping and pt want to know if he can take half of his Melotroprolol 25mg . Please call pt

## 2012-12-11 NOTE — Telephone Encounter (Signed)
Spoke with patient who states heart rate has gradually decreased over the course of the last weeks/months and he is wondering if he can stop taking or decrease his Metoprolol Tartrate 12.5 mg BID.  Patient denies dizziness or light-headedness but does c/o that he feels a slight decrease in ability to concentrate.  Patient states otherwise he is feeling well.  I am routing to Dr. Elease Hashimoto for advice.  I advised patient that we would call him back when we got an answer from Dr. Elease Hashimoto, today or possibly Monday.  Patient verbalized understanding and stated that he might skip his dose tonight if he does not hear back from [place] to see if this helps his concentration.

## 2012-12-14 ENCOUNTER — Telehealth: Payer: Self-pay | Admitting: Cardiovascular Disease

## 2012-12-14 NOTE — Telephone Encounter (Signed)
PT BACK ON USUAL DOSE OF METOPROLOL, TOLD HIM IF HE IS SYMPTOMATIC WITH LOW HEART RATE TO HOLD ONE DOSE, PT AGREED TO PLAN.

## 2012-12-14 NOTE — Telephone Encounter (Signed)
New Problem:    Patient called in that he stopped the medication that he was told to on 12/11/12 but by 12/13/12 he was not feeling well so he began taking it again on 12/15/12.  Please call back.

## 2012-12-16 ENCOUNTER — Telehealth: Payer: Self-pay | Admitting: *Deleted

## 2012-12-16 ENCOUNTER — Encounter: Payer: Self-pay | Admitting: *Deleted

## 2012-12-16 MED ORDER — OMEGA-3-ACID ETHYL ESTERS 1 G PO CAPS: 3.0000 | ORAL_CAPSULE | Freq: Every day | ORAL | Status: DC

## 2012-12-16 NOTE — Telephone Encounter (Signed)
This encounter was created in error - please disregard.

## 2012-12-16 NOTE — Telephone Encounter (Signed)
Needed local script sent and mail order sent, pt request name brand.

## 2012-12-16 NOTE — Telephone Encounter (Signed)
New problem   Pt stated his [facility] is on back order til June because his prescription was written for a generic. Pt is going out of town Thursday night and need some to carry him over until he get back. Please call pt

## 2012-12-16 NOTE — Telephone Encounter (Signed)
Med refilled.

## 2013-01-19 ENCOUNTER — Telehealth: Payer: Self-pay | Admitting: Cardiovascular Disease

## 2013-01-19 NOTE — Telephone Encounter (Signed)
New Problem  Pt is concerned about taking the drug metoprolol after reading an article about the drug. He wants to know if there is something else he can take.

## 2013-01-19 NOTE — Telephone Encounter (Signed)
Pt saw in the [place] [employer], that the drug Metoprolol is made in [place] and the FDA is recalling this drug. Pt said has been taking this drug for sometime, and he has not have any problems. Pt wants to know if metoprolol  is the medication  Dr. Elease Hashimoto wants to keep him taking, or does he needs to change to something else.

## 2013-01-20 NOTE — Telephone Encounter (Signed)
PT INFORMED AND WILL CHECK TO SEE WHERE HIS MED COMES FROM.

## 2013-01-20 NOTE — Telephone Encounter (Signed)
That is probably OK

## 2013-03-26 ENCOUNTER — Other Ambulatory Visit: Payer: Self-pay | Admitting: *Deleted

## 2013-03-26 MED ORDER — ROSUVASTATIN CALCIUM 10 MG PO TABS: 10.0000 mg | ORAL_TABLET | Freq: Every day | ORAL | Status: DC

## 2013-03-30 ENCOUNTER — Other Ambulatory Visit: Payer: Self-pay | Admitting: *Deleted

## 2013-03-30 MED ORDER — METOPROLOL TARTRATE 25 MG PO TABS: 12.5000 mg | ORAL_TABLET | Freq: Two times a day (BID) | ORAL | Status: DC

## 2013-04-08 ENCOUNTER — Other Ambulatory Visit: Payer: Self-pay | Admitting: *Deleted

## 2013-04-08 MED ORDER — METOPROLOL TARTRATE 25 MG PO TABS: 12.5000 mg | ORAL_TABLET | Freq: Two times a day (BID) | ORAL | Status: DC

## 2013-04-20 ENCOUNTER — Telehealth: Payer: Self-pay | Admitting: Cardiovascular Disease

## 2013-04-20 MED ORDER — ALPRAZOLAM 0.5 MG PO TABS: 0.5000 mg | ORAL_TABLET | Freq: Every evening | ORAL | Status: DC | PRN

## 2013-04-20 NOTE — Telephone Encounter (Signed)
PER Dr Nahser/ ok to fill xanax, done/ called into pharmacy.

## 2013-04-20 NOTE — Telephone Encounter (Signed)
Follow Up:  Pt states he called earlier today. Pt is calling in regards to a medication refill that he needs before he goes out of town later today. I transferred the pt to our refill team's voice mail. However, the pt stated he wanted me to send a message to [facility] directly. Pt would like to be called back regarding a medication he needs filled before he goes out of town.

## 2013-04-27 ENCOUNTER — Other Ambulatory Visit (INDEPENDENT_AMBULATORY_CARE_PROVIDER_SITE_OTHER): Payer: BC Managed Care – PPO

## 2013-04-27 DIAGNOSIS — R002 Palpitations: Secondary | ICD-10-CM

## 2013-04-27 LAB — HEPATIC FUNCTION PANEL
ALT: 20 U/L (ref 0–53)
Bilirubin, Direct: 0 mg/dL (ref 0.0–0.3)
Total Bilirubin: 0.9 mg/dL (ref 0.3–1.2)

## 2013-04-27 LAB — BASIC METABOLIC PANEL
BUN: 21 mg/dL (ref 6–23)
Chloride: 105 mEq/L (ref 96–112)
Creatinine, Ser: 1.1 mg/dL (ref 0.4–1.5)
GFR: 75.51 mL/min (ref >60.00)
Potassium: 3.5 mEq/L (ref 3.5–5.1)

## 2013-04-27 LAB — LIPID PANEL
LDL Cholesterol: 53 mg/dL (ref 0–99)
Total CHOL/HDL Ratio: 3
Triglycerides: 86 mg/dL (ref 0.0–149.0)
VLDL: 17.2 mg/dL (ref 0.0–40.0)

## 2013-05-26 ENCOUNTER — Other Ambulatory Visit: Payer: Self-pay

## 2013-05-26 MED ORDER — NIACIN ER (ANTIHYPERLIPIDEMIC) 1000 MG PO TBCR: EXTENDED_RELEASE_TABLET | ORAL | Status: DC

## 2013-05-26 MED ORDER — FENOFIBRATE 145 MG PO TABS: 145.0000 mg | ORAL_TABLET | Freq: Every day | ORAL | Status: DC

## 2013-06-16 ENCOUNTER — Telehealth: Payer: Self-pay | Admitting: Cardiovascular Disease

## 2013-06-16 ENCOUNTER — Telehealth: Payer: Self-pay | Admitting: *Deleted

## 2013-06-16 NOTE — Telephone Encounter (Signed)
Spoke with Dr Darrelyn Hillock, pt c/o back pain. Dr feels it is atypical back pain that he had and may need cardiac appointment.  Pt currently in no pain no sob, pain happened last week when he was in [place]. Appointment made for tomorrow with Dr Elease Hashimoto in [place] office.

## 2013-06-16 NOTE — Telephone Encounter (Signed)
I received a call from [place] last night about back pain.  The pain was very atypical for cardiac concern.  No dyspnea.  Seems to be worse with sitting , twisting torso.  He saw Dr. Darrelyn Hillock today who wanted him to be cleared by cardiology.  We have offered him an apt. With me in my [facility] clinic tomorrow.    Unfortunately, his son ( who is playing basketball in [place]) had a head injury and had neurosurgery yesterday.  [name] may need to leave quickly to return to [place].  If the pain worsens or if he needs to get this resolved quickly, I have suggested that he go to the ER.  I would suggest a CT angio of the aorta to r/o dissection.  If his ECG is normal and Troponin is normal, I doubt this is a coronary process since he has been hurting for several days.  Vesta Mixer, Montez Hageman., MD, [facility] 06/16/2013, 6:06 PM Office - [phone] Pager [phone]

## 2013-06-17 ENCOUNTER — Encounter: Payer: Self-pay | Admitting: Cardiovascular Disease

## 2013-06-17 ENCOUNTER — Ambulatory Visit (INDEPENDENT_AMBULATORY_CARE_PROVIDER_SITE_OTHER): Payer: BC Managed Care – PPO | Admitting: Cardiovascular Disease

## 2013-06-17 ENCOUNTER — Encounter: Payer: Self-pay | Admitting: *Deleted

## 2013-06-17 VITALS — BP 122/84 | HR 51 | Ht 74.0 in | Wt 183.5 lb

## 2013-06-17 DIAGNOSIS — M549 Dorsalgia, unspecified: Secondary | ICD-10-CM

## 2013-06-17 DIAGNOSIS — I7100 Dissection of unspecified site of aorta: Secondary | ICD-10-CM

## 2013-06-17 DIAGNOSIS — R002 Palpitations: Secondary | ICD-10-CM

## 2013-06-17 DIAGNOSIS — M5410 Radiculopathy, site unspecified: Secondary | ICD-10-CM | POA: Insufficient documentation

## 2013-06-17 NOTE — Assessment & Plan Note (Signed)
Gregory Hill presents with nagging, severe back pain.  The pain is associated with nausea - especially if the pain gets severe.   His ECG is normal .  There is not evidence that this is acute coronary syndrome.    No pleuretic CP - no supporting evidence that this is  Pulmonary embolus.  Will get a CT angio to rule out aortic dissection.  Have drawn BMP.     I will see him in 6 months.

## 2013-06-17 NOTE — Assessment & Plan Note (Signed)
His BP is well controlled 

## 2013-06-17 NOTE — Patient Instructions (Addendum)
Need to have a CT angiogram of aorta today for aortic dissection. Patient will not have anything to eat or drink after 1:00 today. STAT BMET today.

## 2013-06-17 NOTE — Progress Notes (Signed)
[name] Date of Birth  11-16-1950 [facility] Office  [address]    [address]   [address] [place], [place]  [address]    [place], [place]  [address] [phone]  Fax  [phone]  [phone]  Fax [phone]  Problem List: 1. Palpitations 2. Premature ventricular contractions 3. Paroxysmal atrial fibrillation 4. Dyslipidemia 5. Hypertension  History of Present Illness:  [name] is a 62 yo with a hx of palpitations, HTN, hyperlipidemia, transient atrial fibrillation.  He has a high stress job. He's noted episodes of tachycardia recently - as fast as 120 bmp.  He still walks regularly - 2-3 miles several times a week.  He has a strong family history of sudden cardiac death.  He's worn an event monitor several years ago which revealed normal sinus rhythm with occasional premature ventricular contractions.  His palpitations have improved some over the past several months. They have improved with propranolol and now is on Metoprolol 12.5 BID.  October 06, 2012:  He is doing well.  Still having some palpitations.  He is still walking regularly.   He is staying hydrated.  He is not having any problems with his palpitations.   Nov. 20, 2014:  [name] presents with persistent back pain for the past several weeks.  He has been in [place] watching his son play basketball.  He has had back pain for 3-4 weeks, associated with nausea ( especially when he has increase back / flank ache).  Does not feel like a ripping or tearing sensation.  Not worsened with twisting or turning his torso.   He has walked regularly - no CP or worsening of his back pain.   No pleuretic Cp.  Has had some leg cramping in his legs recently.    The pain acutely worsens with lying down ( increased significantly when I had him lie supine)    Current Outpatient Prescriptions on File Prior to Visit  Medication Sig Dispense Refill  . ALPRAZolam (XANAX) 0.5 MG tablet Take 1 tablet (0.5 mg  total) by mouth at bedtime as needed for sleep.  15 tablet  0  . aspirin 81 MG tablet Take 81 mg by mouth daily.        . cholecalciferol (VITAMIN D) 1000 UNITS tablet Take 1,000 Units by mouth daily.       . fenofibrate (TRICOR) 145 MG tablet Take 1 tablet (145 mg total) by mouth daily.  90 tablet  3  . metoprolol tartrate (LOPRESSOR) 25 MG tablet Take 0.5 tablets (12.5 mg total) by mouth 2 (two) times daily.  90 tablet  3  . Multiple Vitamin (MULTIVITAMIN) tablet Take 1 tablet by mouth daily.        . niacin (NIASPAN) 1000 MG CR tablet One tablet daily  90 tablet  3  . omega-3 acid ethyl esters (LOVAZA) 1 G capsule Take 3 capsules (3 g total) by mouth daily.  270 capsule  3  . propranolol (INDERAL) 10 MG tablet Take 1 tablet (10 mg total) by mouth as needed. As needed for palpitations  30 tablet  3  . ramipril (ALTACE) 10 MG capsule Take 1 capsule (10 mg total) by mouth 2 (two) times daily.  180 capsule  3  . rosuvastatin (CRESTOR) 10 MG tablet Take 1 tablet (10 mg total) by mouth daily.  90 tablet  2   No current facility-administered medications on file prior to visit.  propranolol 10  mg 4 times a day PRN   Allergies  Allergen Reactions  . Azithromycin     As child   . Bystolic [Nebivolol Hcl]     Past Medical History  Diagnosis Date  . Hypertension   . Hyperlipidemia   . Anxiety   . Atrial fibrillation   . PVC (premature ventricular contraction)     Past Surgical History  Procedure Laterality Date  . None      History  Smoking status  . Never Smoker   Smokeless tobacco  . Never Used    History  Alcohol Use  . 1.5 - 2.5 oz/week  . 3-5 drink(s) per week    Family History  Problem Relation Age of Onset  . Heart disease Mother   . Heart attack Mother     Reviw of Systems:  Reviewed in the HPI.  All other systems are negative.  Physical Exam: Blood pressure 122/84, pulse 51, height 6\' 2"  (1.88 m), weight 183 lb 8 oz (83.235 kg). General: Well developed,  well nourished, in no acute distress.  Head: Normocephalic, atraumatic, sclera non-icteric, mucus membranes are moist  Neck: Supple. Negative for carotid bruits. JVD not elevated.  Lungs: Clear bilaterally to auscultation without wheezes, rales, or rhonchi. Breathing is normal.  Heart: RR with S1 S2. No murmurs, rubs, or gallops appreciated.  Abdomen: Soft, non-tender, non-distended with normoactive bowel sounds. No hepatomegaly. No rebound/guarding. No obvious abdominal masses.  Msk:  Strength and tone appear normal for age.  Extremities: No clubbing or cyanosis. No edema.  Distal pedal pulses are 2+ and equal bilaterally.  Neuro: Alert and oriented X 3. Moves all extremities spontaneously.  Psych:  Responds to questions appropriately with a normal affect.    ECG:  Nov. 20, 2014:  Sinus brady at 72,  Otherwise normal.    Assessment / Plan:

## 2013-06-18 ENCOUNTER — Inpatient Hospital Stay: Admission: RE | Admit: 2013-06-18 | Payer: BC Managed Care – PPO | Source: Ambulatory Visit

## 2013-06-21 ENCOUNTER — Telehealth: Payer: Self-pay

## 2013-06-21 NOTE — Telephone Encounter (Signed)
Message copied by Marilynne Halsted on Mon Jun 21, 2013 10:20 AM ------      Message from: Velva, [place] J      Created: Fri Jun 18, 2013  6:06 PM       Labs areok       ------

## 2013-06-21 NOTE — Telephone Encounter (Signed)
Left detailed message on voicemail with pt's results.

## 2013-07-08 ENCOUNTER — Telehealth: Payer: Self-pay | Admitting: Cardiovascular Disease

## 2013-07-08 NOTE — Telephone Encounter (Signed)
New message     On anti inflammatory for back pain presc by his orthopedic doctor but it raises his bp---so, will it be best for him to take tylenol only? And what if the tylenol does not help his back pain, what can he take that will not raise his blood pressure but help his back pain?

## 2013-07-08 NOTE — Telephone Encounter (Signed)
Pt was advised to call ortho dr to see if he should try something else. Pt bp was 170/85 he stopped the NSAID and bp has decreased to 150/80. Pt was told tylenol is only for pain and will not help inflammation.  Advised to call ortho/ pt agreed.

## 2013-07-09 ENCOUNTER — Other Ambulatory Visit: Payer: Self-pay | Admitting: Orthopedic Surgery

## 2013-07-09 ENCOUNTER — Ambulatory Visit
Admission: RE | Admit: 2013-07-09 | Discharge: 2013-07-09 | Disposition: A | Discharge status: Home / Self Care | Payer: BC Managed Care – PPO | Source: Ambulatory Visit | Attending: Orthopedic Surgery | Admitting: Orthopedic Surgery

## 2013-07-09 DIAGNOSIS — M459 Ankylosing spondylitis of unspecified sites in spine: Secondary | ICD-10-CM

## 2013-07-12 ENCOUNTER — Other Ambulatory Visit: Payer: Self-pay | Admitting: Orthopedic Surgery

## 2013-07-12 DIAGNOSIS — M459 Ankylosing spondylitis of unspecified sites in spine: Secondary | ICD-10-CM

## 2013-08-09 ENCOUNTER — Ambulatory Visit (INDEPENDENT_AMBULATORY_CARE_PROVIDER_SITE_OTHER): Payer: BC Managed Care – PPO | Admitting: Cardiovascular Disease

## 2013-08-09 ENCOUNTER — Telehealth: Payer: Self-pay | Admitting: Cardiovascular Disease

## 2013-08-09 ENCOUNTER — Encounter: Payer: Self-pay | Admitting: Cardiovascular Disease

## 2013-08-09 VITALS — BP 100/78 | HR 64 | Ht 74.0 in | Wt 187.0 lb

## 2013-08-09 DIAGNOSIS — E785 Hyperlipidemia, unspecified: Secondary | ICD-10-CM

## 2013-08-09 DIAGNOSIS — M549 Dorsalgia, unspecified: Secondary | ICD-10-CM

## 2013-08-09 DIAGNOSIS — R071 Chest pain on breathing: Secondary | ICD-10-CM

## 2013-08-09 DIAGNOSIS — I10 Essential (primary) hypertension: Secondary | ICD-10-CM

## 2013-08-09 DIAGNOSIS — R0789 Other chest pain: Secondary | ICD-10-CM

## 2013-08-09 DIAGNOSIS — R079 Chest pain, unspecified: Secondary | ICD-10-CM

## 2013-08-09 NOTE — Progress Notes (Signed)
Gregory Hill Date of Birth  [birth year] [facility] [address]    [place]   [place], [place]  [address]    [place], [place]  [address] [phone]  Fax  [phone]  [phone]  Fax [phone]  Problem List: 1. Palpitations 2. Premature ventricular contractions 3. Paroxysmal atrial fibrillation 4. Dyslipidemia 5. Hypertension  History of Present Illness:  Gregory Hill is a 63 yo with a hx of palpitations, HTN, hyperlipidemia, transient atrial fibrillation.  He has a high stress job. He's noted episodes of tachycardia recently - as fast as 120 bmp.  He still walks regularly - 2-3 miles several times a week.  He has a strong family history of sudden cardiac death.  He's worn an event monitor several years ago which revealed normal sinus rhythm with occasional premature ventricular contractions.  His palpitations have improved some over the past several months. They have improved with propranolol and now is on Metoprolol 12.5 BID.  October 06, 2012:  He is doing well.  Still having some palpitations.  He is still walking regularly.   He is staying hydrated.  He is not having any problems with his palpitations.   Nov. 20, 2014:  Gregory Hill presents with persistent back pain for the past several weeks.  He has been in [place] watching his son play basketball.  He has had back pain for 3-4 weeks, associated with nausea ( especially when he has increase back / flank ache).  Does not feel like a ripping or tearing sensation.  Not worsened with twisting or turning his torso.   He has walked regularly - no CP or worsening of his back pain.   No pleuretic Cp.  Has had some leg cramping in his legs recently.  The pain acutely worsens with lying down ( increased significantly when I had him lie supine)    Jan. 12, 2015:  Gregory Hill continues to have intermittent back and chest pain.  Seems to get worse if he stands up or changes position.    Chest CT in  mid Dec. was normal.   Still exercising.  Regularly.  2-3 miles each time.    Current Outpatient Prescriptions on File Prior to Visit  Medication Sig Dispense Refill  . aspirin 81 MG tablet Take 81 mg by mouth daily.        . cholecalciferol (VITAMIN D) 1000 UNITS tablet Take 1,000 Units by mouth daily.       . fenofibrate (TRICOR) 145 MG tablet Take 1 tablet (145 mg total) by mouth daily.  90 tablet  3  . metoprolol tartrate (LOPRESSOR) 25 MG tablet Take 0.5 tablets (12.5 mg total) by mouth 2 (two) times daily.  90 tablet  3  . Multiple Vitamin (MULTIVITAMIN) tablet Take 1 tablet by mouth daily.        . niacin (NIASPAN) 1000 MG CR tablet One tablet daily  90 tablet  3  . omega-3 acid ethyl esters (LOVAZA) 1 G capsule Take 3 capsules (3 g total) by mouth daily.  270 capsule  3  . propranolol (INDERAL) 10 MG tablet Take 1 tablet (10 mg total) by mouth as needed. As needed for palpitations  30 tablet  3  . ramipril (ALTACE) 10 MG capsule Take 1 capsule (10 mg total) by mouth 2 (two) times daily.  180 capsule  3  . rosuvastatin (CRESTOR) 10 MG tablet Take 1 tablet (10 mg total) by  mouth daily.  90 tablet  2   No current facility-administered medications on file prior to visit.  propranolol 10 mg 4 times a day PRN   Allergies  Allergen Reactions  . Azithromycin     As child   . Bystolic [Nebivolol Hcl]     Past Medical History  Diagnosis Date  . Hypertension   . Hyperlipidemia   . Anxiety   . Atrial fibrillation   . PVC (premature ventricular contraction)     Past Surgical History  Procedure Laterality Date  . None      History  Smoking status  . Never Smoker   Smokeless tobacco  . Never Used    History  Alcohol Use  . 1.5 - 2.5 oz/week  . 3-5 drink(s) per week    Family History  Problem Relation Age of Onset  . Heart disease Mother   . Heart attack Mother     Reviw of Systems:  Reviewed in the HPI.  All other systems are negative.  Physical Exam: Blood  pressure 100/78, pulse 64, height 6\' 2"  (1.88 m), weight 187 lb (84.823 kg). General: Well developed, well nourished, in no acute distress.  Head: Normocephalic, atraumatic, sclera non-icteric, mucus membranes are moist  Neck: Supple. Negative for carotid bruits. JVD not elevated.  Lungs: Clear bilaterally to auscultation without wheezes, rales, or rhonchi. Breathing is normal.  Heart: RR with S1 S2. No murmurs, rubs, or gallops appreciated.  Abdomen: Soft, non-tender, non-distended with normoactive bowel sounds. No hepatomegaly. No rebound/guarding. No obvious abdominal masses.  Msk:  Strength and tone appear normal for age.  Extremities: No clubbing or cyanosis. No edema.  Distal pedal pulses are 2+ and equal bilaterally.  Neuro: Alert and oriented X 3. Moves all extremities spontaneously.  Psych:  Responds to questions appropriately with a normal affect.    ECG:  Jan. 12, 2015:  NSR at 46.  RAD. Otherwise, normal ECG    Assessment / Plan:

## 2013-08-09 NOTE — Assessment & Plan Note (Signed)
His BP is stable

## 2013-08-09 NOTE — Assessment & Plan Note (Signed)
Gregory Hill  presents today with continued atypical chest pain and back pain. The pain is somewhat migratory. It is not associated with exertion. He doesn't be related to standing up or sitting down or changing position. I suspect it is musculoskeletal. He's had a CT of the chest which was normal.  For now I do not recommend any additional testing. Ill see him again in  3 months for followup visit.

## 2013-08-09 NOTE — Telephone Encounter (Signed)
Cp continues under left breast. Pain is off and on and happens daily/ hourly. Describes an ache sensation but it is not like muscle ache. Usually happens when going from a seated to standing position. Pt was made an app today. Pt was accepting of plan.

## 2013-08-09 NOTE — Patient Instructions (Signed)
Your physician recommends that you continue on your current medications as directed. Please refer to the Current Medication list given to you today.   Your physician wants you to follow-up in: 3 MONTHS /// MID APRIL You will receive a reminder letter in the mail two months in advance. If you don't receive a letter, please call our office to schedule the follow-up appointment.

## 2013-08-09 NOTE — Telephone Encounter (Signed)
New Problem:  Pt states he is continuing to have issues with pain on the left side of his chest. Pt states he is not having that pain now. It comes and goes... Pt states his PcP told him to reach out to Dr. Elmarie Shiley office. Pt would like a call back.

## 2013-08-09 NOTE — Assessment & Plan Note (Signed)
Continue to follow.

## 2013-09-20 ENCOUNTER — Other Ambulatory Visit: Payer: Self-pay | Admitting: Cardiovascular Disease

## 2013-09-21 ENCOUNTER — Ambulatory Visit: Payer: BC Managed Care – PPO | Admitting: Cardiovascular Disease

## 2013-09-21 ENCOUNTER — Other Ambulatory Visit: Payer: BC Managed Care – PPO

## 2013-11-10 ENCOUNTER — Other Ambulatory Visit: Payer: Self-pay | Admitting: Cardiovascular Disease

## 2014-01-01 ENCOUNTER — Other Ambulatory Visit: Payer: Self-pay | Admitting: Cardiovascular Disease

## 2014-01-10 ENCOUNTER — Other Ambulatory Visit: Payer: Self-pay | Admitting: Cardiovascular Disease

## 2014-01-11 ENCOUNTER — Other Ambulatory Visit: Payer: Self-pay | Admitting: *Deleted

## 2014-01-11 MED ORDER — OMEGA-3-ACID ETHYL ESTERS 1 G PO CAPS: 3.0000 | ORAL_CAPSULE | Freq: Every day | ORAL | Status: DC

## 2014-01-25 ENCOUNTER — Ambulatory Visit (INDEPENDENT_AMBULATORY_CARE_PROVIDER_SITE_OTHER): Payer: BC Managed Care – PPO | Admitting: *Deleted

## 2014-01-25 DIAGNOSIS — I493 Ventricular premature depolarization: Secondary | ICD-10-CM

## 2014-01-25 DIAGNOSIS — I4949 Other premature depolarization: Secondary | ICD-10-CM

## 2014-01-25 DIAGNOSIS — E785 Hyperlipidemia, unspecified: Secondary | ICD-10-CM

## 2014-01-25 LAB — LIPID PANEL
CHOL/HDL RATIO: 4
CHOLESTEROL: 94 mg/dL (ref 0–200)
HDL: 26.3 mg/dL — AB (ref >39.00)
LDL CALC: 53 mg/dL (ref 0–99)
NonHDL: 67.7
TRIGLYCERIDES: 74 mg/dL (ref 0.0–149.0)
VLDL: 14.8 mg/dL (ref 0.0–40.0)

## 2014-01-25 LAB — BASIC METABOLIC PANEL
BUN: 22 mg/dL (ref 6–23)
CALCIUM: 9.1 mg/dL (ref 8.4–10.5)
CHLORIDE: 104 meq/L (ref 96–112)
CO2: 23 mEq/L (ref 19–32)
CREATININE: 1.2 mg/dL (ref 0.4–1.5)
GFR: 65.2 mL/min (ref >60.00)
Glucose, Bld: 103 mg/dL — ABNORMAL HIGH (ref 70–99)
Potassium: 3.6 mEq/L (ref 3.5–5.1)
SODIUM: 136 meq/L (ref 135–145)

## 2014-01-25 LAB — HEPATIC FUNCTION PANEL
ALBUMIN: 4.2 g/dL (ref 3.5–5.2)
ALK PHOS: 24 U/L — AB (ref 39–117)
ALT: 21 U/L (ref 0–53)
AST: 29 U/L (ref 0–37)
Bilirubin, Direct: 0.1 mg/dL (ref 0.0–0.3)
TOTAL PROTEIN: 7.2 g/dL (ref 6.0–8.3)
Total Bilirubin: 0.8 mg/dL (ref 0.2–1.2)

## 2014-01-26 ENCOUNTER — Telehealth: Payer: Self-pay | Admitting: Cardiovascular Disease

## 2014-01-26 NOTE — Telephone Encounter (Signed)
**Note De-identified [facility] Obfuscation** The pt is advised of his lab results, he verbalized understanding. 

## 2014-01-26 NOTE — Telephone Encounter (Signed)
New Message:  Pt is calling to hear his recent test results

## 2014-01-31 ENCOUNTER — Ambulatory Visit: Payer: BC Managed Care – PPO | Admitting: Cardiovascular Disease

## 2014-01-31 ENCOUNTER — Other Ambulatory Visit: Payer: BC Managed Care – PPO

## 2014-02-02 ENCOUNTER — Other Ambulatory Visit: Payer: BC Managed Care – PPO

## 2014-02-07 ENCOUNTER — Encounter: Payer: Self-pay | Admitting: Cardiovascular Disease

## 2014-02-07 ENCOUNTER — Ambulatory Visit (INDEPENDENT_AMBULATORY_CARE_PROVIDER_SITE_OTHER): Payer: BC Managed Care – PPO | Admitting: Cardiovascular Disease

## 2014-02-07 VITALS — BP 116/68 | HR 60 | Ht 74.0 in | Wt 187.0 lb

## 2014-02-07 DIAGNOSIS — I10 Essential (primary) hypertension: Secondary | ICD-10-CM

## 2014-02-07 DIAGNOSIS — E785 Hyperlipidemia, unspecified: Secondary | ICD-10-CM

## 2014-02-07 DIAGNOSIS — I4949 Other premature depolarization: Secondary | ICD-10-CM

## 2014-02-07 DIAGNOSIS — I493 Ventricular premature depolarization: Secondary | ICD-10-CM

## 2014-02-07 NOTE — Assessment & Plan Note (Signed)
He has occasional PVCs.

## 2014-02-07 NOTE — Progress Notes (Signed)
Gregory Hill Date of Birth  [birth year] [address]    [place]   [place], [place]  [address]    [place],   [address] [phone]  Fax  [phone]  [phone]  Fax [phone]  Problem List: 1. Palpitations 2. Premature ventricular contractions 3. Paroxysmal atrial fibrillation 4. Dyslipidemia 5. Hypertension  History of Present Illness:  Gregory Hill is a 63 yo with a hx of palpitations, HTN, hyperlipidemia, transient atrial fibrillation.  He has a high stress job. He's noted episodes of tachycardia recently - as fast as 120 bmp.  He still walks regularly - 2-3 miles several times a week.  He has a strong family history of sudden cardiac death.  He's worn an event monitor several years ago which revealed normal sinus rhythm with occasional premature ventricular contractions.  His palpitations have improved some over the past several months. They have improved with propranolol and now is on Metoprolol 12.5 BID.  October 06, 2012:  He is doing well.  Still having some palpitations.  He is still walking regularly.   He is staying hydrated.  He is not having any problems with his palpitations.   Nov. 20, 2014:  Gregory Hill presents with persistent back pain for the past several weeks.  He has been in [place] watching his son play basketball.  He has had back pain for 3-4 weeks, associated with nausea ( especially when he has increase back / flank ache).  Does not feel like a ripping or tearing sensation.  Not worsened with twisting or turning his torso.   He has walked regularly - no CP or worsening of his back pain.   No pleuretic Cp.  Has had some leg cramping in his legs recently.  The pain acutely worsens with lying down ( increased significantly when I had him lie supine)    Jan. 12, 2015:  Gregory Hill continues to have intermittent back and chest pain.  Seems to get worse if he stands up or changes position.    Chest CT in  mid Dec. was normal.   Still exercising.  Regularly.  2-3 miles each time.    February 07, 2014:    Current Outpatient Prescriptions on File Prior to Visit  Medication Sig Dispense Refill  . aspirin 81 MG tablet Take 81 mg by mouth daily.        . cholecalciferol (VITAMIN D) 1000 UNITS tablet Take 1,000 Units by mouth daily.       . CRESTOR 10 MG tablet TAKE 1 BY MOUTH DAILY  90 tablet  0  . fenofibrate (TRICOR) 145 MG tablet Take 1 tablet (145 mg total) by mouth daily.  90 tablet  3  . metoprolol tartrate (LOPRESSOR) 25 MG tablet Take 0.5 tablets (12.5 mg total) by mouth 2 (two) times daily.  90 tablet  3  . Multiple Vitamin (MULTIVITAMIN) tablet Take 1 tablet by mouth daily.        . niacin (NIASPAN) 1000 MG CR tablet One tablet daily  90 tablet  3  . omega-3 acid ethyl esters (LOVAZA) 1 G capsule Take 3 capsules (3 g total) by mouth daily.  270 capsule  0  . ramipril (ALTACE) 10 MG capsule TAKE 1 BY MOUTH TWICE DAILY  180 capsule  0   No current facility-administered medications on file prior to visit.  propranolol 10 mg 4 times a day PRN   Allergies  Allergen Reactions  . Azithromycin     As child   . Bystolic [Nebivolol Hcl]     Past Medical History  Diagnosis Date  . Hypertension   . Hyperlipidemia   . Anxiety   . Atrial fibrillation   . PVC (premature ventricular contraction)     Past Surgical History  Procedure Laterality Date  . None      History  Smoking status  . Never Smoker   Smokeless tobacco  . Never Used    History  Alcohol Use  . 1.5 - 2.5 oz/week  . 3-5 drink(s) per week    Family History  Problem Relation Age of Onset  . Heart disease Mother   . Heart attack Mother     Reviw of Systems:  Reviewed in the HPI.  All other systems are negative.  Physical Exam: Blood pressure 116/68, pulse 60, height 6\' 2"  (1.88 m), weight 187 lb (84.823 kg). General: Well developed, well nourished, in no acute distress.  Head: Normocephalic, atraumatic,  sclera non-icteric, mucus membranes are moist  Neck: Supple. Negative for carotid bruits. JVD not elevated.  Lungs: Clear bilaterally to auscultation without wheezes, rales, or rhonchi. Breathing is normal.  Heart: RR with S1 S2. No murmurs, rubs, or gallops appreciated.  Abdomen: Soft, non-tender, non-distended with normoactive bowel sounds. No hepatomegaly. No rebound/guarding. No obvious abdominal masses.  Msk:  Strength and tone appear normal for age.  Extremities: No clubbing or cyanosis. No edema.  Distal pedal pulses are 2+ and equal bilaterally.  Neuro: Alert and oriented X 3. Moves all extremities spontaneously.  Psych:  Responds to questions appropriately with a normal affect.    ECG:  Jan. 12, 2015:  NSR at 5.  RAD. Otherwise, normal ECG    Assessment / Plan:

## 2014-02-07 NOTE — Assessment & Plan Note (Signed)
His BP is ok Continue current meds

## 2014-02-07 NOTE — Patient Instructions (Signed)
Your physician recommends that you continue on your current medications as directed. Please refer to the Current Medication list given to you today.  Your physician wants you to follow-up in: 1 year with Dr. Nahser.  You will receive a reminder letter in the mail two months in advance. If you don't receive a letter, please call our office to schedule the follow-up appointment.  

## 2014-02-07 NOTE — Assessment & Plan Note (Signed)
His HDL remains low.  Continue miacin, fenofibate, crestor. He walks regularly. He sill try a glass of wine each night.

## 2014-02-17 ENCOUNTER — Other Ambulatory Visit: Payer: Self-pay

## 2014-02-17 MED ORDER — OMEGA-3-ACID ETHYL ESTERS 1 G PO CAPS: 3.0000 | ORAL_CAPSULE | Freq: Every day | ORAL | Status: DC

## 2014-04-02 ENCOUNTER — Other Ambulatory Visit: Payer: Self-pay | Admitting: Cardiovascular Disease

## 2014-04-20 ENCOUNTER — Other Ambulatory Visit: Payer: Self-pay | Admitting: Cardiovascular Disease

## 2014-05-24 ENCOUNTER — Other Ambulatory Visit: Payer: Self-pay | Admitting: Cardiovascular Disease

## 2014-06-06 ENCOUNTER — Other Ambulatory Visit: Payer: Self-pay | Admitting: Cardiovascular Disease

## 2014-08-26 ENCOUNTER — Other Ambulatory Visit: Payer: Self-pay | Admitting: Cardiovascular Disease

## 2014-09-06 ENCOUNTER — Telehealth: Payer: Self-pay | Admitting: Cardiovascular Disease

## 2014-09-06 NOTE — Telephone Encounter (Signed)
New Message  Pt wanted to speak w/ Rn about medication. Please call back and discuss.

## 2014-09-06 NOTE — Telephone Encounter (Signed)
Spoke with patient who states he has had a recent sinus infection and continues to have pain in his teeth; asks if it is okay to take Ibuprofen with his daily Aspirin 81 mg.  States he also has back pain.  Patient states he is taking 1 Ibuprofen daily; asks if Aleve would be more effective.  I advised patient that he can choose any NSAID for a limited time, advised a higher dose of Ibuprofen would be more effective than 1 tablet and advised him to discontinue once pain is resolved.   I advised patient to call back if pain persists despite NSAID therapy.  Patient verbalized understanding and agreement.

## 2014-10-17 ENCOUNTER — Other Ambulatory Visit: Payer: Self-pay | Admitting: Cardiovascular Disease

## 2014-10-20 ENCOUNTER — Ambulatory Visit (INDEPENDENT_AMBULATORY_CARE_PROVIDER_SITE_OTHER): Payer: BLUE CROSS/BLUE SHIELD | Admitting: Sports Medicine

## 2014-10-20 ENCOUNTER — Telehealth: Payer: Self-pay | Admitting: *Deleted

## 2014-10-20 ENCOUNTER — Encounter: Payer: Self-pay | Admitting: Sports Medicine

## 2014-10-20 VITALS — BP 115/63 | Ht 74.0 in | Wt 185.0 lb

## 2014-10-20 DIAGNOSIS — M5410 Radiculopathy, site unspecified: Secondary | ICD-10-CM

## 2014-10-20 MED ORDER — GABAPENTIN 300 MG PO CAPS: 300.0000 mg | ORAL_CAPSULE | Freq: Every day | ORAL | Status: DC

## 2014-10-20 NOTE — Patient Instructions (Signed)
Try to walk frequently  Do motion exercises for your back  Do some knee to chest stretches  Try gabapentin 300 mgm just at night for 1 month  Keep a diary to see if you are getting less frequent back issues  Recheck with me in 1 month

## 2014-10-20 NOTE — Assessment & Plan Note (Signed)
I suspect his back problems are more radicular in nature from his chronic degenerative disc disease  The pain pattern and the radiation to his upper thigh on the right that suggest that this is more likely coming from the L1 -L2 area  trial of gabapentin just at night  Easy motion exercises for his back  Keep up walking  Recheck one month

## 2014-10-20 NOTE — Telephone Encounter (Signed)
Patient called and wants to know if he can take  Gabatentin 300 mg (neurotin) for back pain with his other cardiac meds?

## 2014-10-20 NOTE — Telephone Encounter (Signed)
That would be fine with me.  

## 2014-10-20 NOTE — Progress Notes (Signed)
Patient ID: Gregory Hill, male   DOB: 06-Jan-1951, 64 y.o.   MRN: [id number]  Patient has LBP increasing past 2 mos He has been evaluated at [facility] He has had some chronic low back issues but was not sure what triggered the significant worsening The orthopedic physicians prescribed prednisone, Flexeril and physical therapy None of these have really helped  His pain pattern is that he gets no pain lying down A little pain when he standing If he sits for a while he gets significant pain when he first gets up that primarily localizes over the left low back and somewhat to the left leg On the right leg he has not sensation that feels like water running or tingling in his upper lateral quadriceps  He walks 2 miles on most days and this has not worsened his pain  He has not had other sensory changes or weakness  No cough or sneeze pain  Examination Pleasant gentleman in no acute distress but more comfortable standing BP 115/63 mmHg  Ht 6\' 2"  (1.88 m)  Wt 185 lb (83.915 kg)  BMI 23.74 kg/m2  Good strength on heel toe and tandem walk Straight leg raise negative bilaterally Back extension he gets some in his thoracic spine but almost none in the lumbar spine - no pain Lateral bend right and left are nonpainful Rotation nonpainful Flexion somewhat tight but no pain  FABER - tighter on left  Strength appears to be good around the core and hip girdle  Reflexes reveal 1+ in the right knee and both ankles Reflexes left patella is absent

## 2014-10-20 NOTE — Telephone Encounter (Signed)
Patient called and wants to know if he can take Gabatentin 300 mg (neurotin) for back pain with his other cardiac meds?

## 2014-10-21 NOTE — Telephone Encounter (Signed)
Informed patient of Dr. Nahsers response  Patient verbalized understanding  

## 2014-10-24 ENCOUNTER — Other Ambulatory Visit: Payer: Self-pay | Admitting: Internal Medicine

## 2014-10-24 DIAGNOSIS — K5792 Diverticulitis of intestine, part unspecified, without perforation or abscess without bleeding: Secondary | ICD-10-CM

## 2014-10-24 DIAGNOSIS — R109 Unspecified abdominal pain: Secondary | ICD-10-CM

## 2014-10-28 ENCOUNTER — Ambulatory Visit
Admission: RE | Admit: 2014-10-28 | Discharge: 2014-10-28 | Disposition: A | Discharge status: Home / Self Care | Payer: BLUE CROSS/BLUE SHIELD | Source: Ambulatory Visit | Attending: Internal Medicine | Admitting: Internal Medicine

## 2014-10-28 DIAGNOSIS — R109 Unspecified abdominal pain: Secondary | ICD-10-CM

## 2014-10-28 DIAGNOSIS — K5792 Diverticulitis of intestine, part unspecified, without perforation or abscess without bleeding: Secondary | ICD-10-CM

## 2014-10-28 MED ORDER — IOPAMIDOL (ISOVUE-300) INJECTION 61%
100.0000 mL | Freq: Once | INTRAVENOUS | Status: AC | PRN
  Administered 2014-10-28: 100 mL via INTRAVENOUS

## 2014-11-09 ENCOUNTER — Ambulatory Visit (INDEPENDENT_AMBULATORY_CARE_PROVIDER_SITE_OTHER): Payer: BLUE CROSS/BLUE SHIELD | Admitting: Sports Medicine

## 2014-11-09 ENCOUNTER — Encounter: Payer: Self-pay | Admitting: Sports Medicine

## 2014-11-09 VITALS — BP 100/70 | Ht 74.0 in | Wt 185.0 lb

## 2014-11-09 DIAGNOSIS — M5440 Lumbago with sciatica, unspecified side: Secondary | ICD-10-CM

## 2014-11-09 DIAGNOSIS — M5410 Radiculopathy, site unspecified: Secondary | ICD-10-CM

## 2014-11-13 ENCOUNTER — Ambulatory Visit
Admission: RE | Admit: 2014-11-13 | Discharge: 2014-11-13 | Disposition: A | Discharge status: Home / Self Care | Payer: BLUE CROSS/BLUE SHIELD | Source: Ambulatory Visit | Attending: Sports Medicine | Admitting: Sports Medicine

## 2014-11-13 DIAGNOSIS — M5440 Lumbago with sciatica, unspecified side: Secondary | ICD-10-CM

## 2014-11-14 ENCOUNTER — Telehealth: Payer: Self-pay | Admitting: Family Medicine

## 2014-11-14 ENCOUNTER — Other Ambulatory Visit: Payer: BLUE CROSS/BLUE SHIELD

## 2014-11-14 DIAGNOSIS — M5410 Radiculopathy, site unspecified: Secondary | ICD-10-CM

## 2014-11-14 DIAGNOSIS — M25559 Pain in unspecified hip: Secondary | ICD-10-CM

## 2014-11-14 NOTE — Assessment & Plan Note (Signed)
Long-standing history with new urologic component warrants further evaluation. Thoracolumbar as well as lumbosacral findings on plain film x-rays may correlate with symptomatology specially any evidence of cauda equina which I feel like is a low likelihood. Will obtain MRI and discuss options once more information is obtained.

## 2014-11-14 NOTE — Telephone Encounter (Signed)
Spoke w pt re MRI--I see nothing specific there that would cause his pain. On discussion, sounds like a lot of his pain occurs during teh act of standing--I am curious whether or not the hip joint itself is involved. We decided to go ahead and order some hip films---he is going to follow up with Dr Paulla Fore in a few days. We will already have the films done for convenience sake. He is in agreement with this p[an.

## 2014-11-14 NOTE — Progress Notes (Signed)
Gregory Hill - 64 y.o. male MRN [id number]  Date of birth: [birth year]  SUBJECTIVE: CC: 1. Low Back pain, follow up     HPI:  Patient here follow-up, mid and low back pain.  Having persistent symptoms in seen by multiple providers.   Reports no significant improvement with gabapentin and his day-to-day activities but does have improvement in his ability to sleep.  Prednisone and muscle relaxers have not been effective.  Pain is worse midline & his most comfortable position is standing.  Reports pain that will radiate down his right leg without significant numbness, tingling or weakness.  Denies any bowel or bladder incontinence.  However, he has been having significant changes in his voiding characteristics and has been having significant midstream dysuria.  He has been seen by his primary care doctor as well as urgent care and CT scans have been reviewed as below  denies any fevers, chills, recent weight gain or weight loss.  No prior history of prostate issues.  Reports up to date on all screening measures  known diverticulosis  No saddle anesthesia     ROS:  Per HPI   HISTORY:  Past Medical, Surgical, Social, and Family History reviewed & updated per EMR.  Pertinent Historical Findings include: Social History   Occupational History  . Not on file.   Social History Main Topics  . Smoking status: Never Smoker   . Smokeless tobacco: Never Used  . Alcohol Use: 1.5 - 2.5 oz/week    3-5 drink(s) per week  . Drug Use: No  . Sexual Activity: Yes    CT scan October 28, 2014: colonic diverticulosis without diverticulitis, negative CT for abdominal pain etiology MRI Lumbar Spine Ordered:  Followed by Dr. Acie Fredrickson for A-Fib and PVCs - on 81 ASA only, no other anti-coagulation currently Problem  Back Pain With Left-Sided Radiculopathy   Review of his x-rays reveals bridging at L1-2 and a significant loss of disc space at T12-L1 and L1-L2 He also has marked loss of disc  space at L5-S1  There is a reverse lordotic curve in his lumbar spine on x-ray There is some facet joint arthritis in the lower lumbar spine  There may be a component of radiation to the right anterior thigh     OBJECTIVE:  VS:   HT:6\' 2"  (188 cm)   WT:185 lb (83.915 kg)  BMI:23.8          BP:100/70 mmHg  HR: bpm  TEMP: ( )  RESP:   PHYSICAL EXAM: GENERAL: adult Caucasian well-built male. No acute distress PSYCH: Alert and appropriately interactive. SKIN: No open skin lesions or abnormal skin markings on areas inspected as below VASCULAR: bilateral lower extremity pulses 2+/4 & DP and PT.  No significant pretibial edema. NEURO: Lower extremity strength is 5+/5 in all myotomes; sensation is intact to light touch in all dermatomes.  Bilateral patellar reflexes 2+/4.  Left Achilles reflex 1+/4, right trace BACK: overall well line.  No significant midline tenderness.  Limited flexion and extension.  Positive straight leg raise on the right.  Negative log roll, FADIR.  Hip abduction strength is 5+/5.  Bilateral knee extensor mechanism intact.  He is able to heal and to walk without difficulty.  DATA OBTAINED: No notes on file  ASSESSMENT & PLAN: See problem based charting & AVS for additional documentation Problem List Items Addressed This Visit    Back pain with left-sided radiculopathy    Long-standing history with new urologic component warrants further evaluation.  Thoracolumbar as well as lumbosacral findings on plain film x-rays may correlate with symptomatology specially any evidence of cauda equina which I feel like is a low likelihood. Will obtain MRI and discuss options once more information is obtained.       Other Visit Diagnoses    Midline low back pain with sciatica, sciatica laterality unspecified    -  Primary    Relevant Orders    MR Lumbar Spine Wo Contrast (Completed)       FOLLOW UP:  No Follow-up on file.

## 2014-11-16 ENCOUNTER — Ambulatory Visit
Admission: RE | Admit: 2014-11-16 | Discharge: 2014-11-16 | Disposition: A | Discharge status: Home / Self Care | Payer: BLUE CROSS/BLUE SHIELD | Source: Ambulatory Visit | Attending: Family Medicine | Admitting: Family Medicine

## 2014-11-16 DIAGNOSIS — M25559 Pain in unspecified hip: Secondary | ICD-10-CM

## 2014-11-16 DIAGNOSIS — M5410 Radiculopathy, site unspecified: Secondary | ICD-10-CM

## 2014-11-17 ENCOUNTER — Telehealth: Payer: Self-pay | Admitting: Sports Medicine

## 2014-11-17 ENCOUNTER — Encounter: Payer: Self-pay | Admitting: *Deleted

## 2014-11-17 ENCOUNTER — Ambulatory Visit: Payer: Self-pay | Admitting: Sports Medicine

## 2014-11-17 DIAGNOSIS — M5410 Radiculopathy, site unspecified: Secondary | ICD-10-CM

## 2014-11-17 NOTE — Telephone Encounter (Signed)
I called and spoke with patient again following the MRI and x-ray of his hip. The pain is essentially only present when seated and worse when going from sitting to standing. Pain does radiate down the left leg. He does have pain with Valsalva.  After reviewing the MRI he does have a slight L5-S1 retrolisthesis and I'm concerned this may be significantly worsening when he is in the seated position. We will send him for a left-sided L5-S1 epidural steroid injection for both diagnostic and therapeutic purposes.  If he does have adequate relief following the injection, consideration for neurosurgical evaluation/orthospine evaluation is warranted.   He does have a history of atrial fibrillation but is not on anticoagulation other than 81 mg ASA at this point.

## 2014-11-18 ENCOUNTER — Other Ambulatory Visit: Payer: Self-pay | Admitting: Sports Medicine

## 2014-11-18 DIAGNOSIS — M5442 Lumbago with sciatica, left side: Secondary | ICD-10-CM

## 2014-11-21 ENCOUNTER — Telehealth: Payer: Self-pay | Admitting: Cardiovascular Disease

## 2014-11-21 NOTE — Telephone Encounter (Signed)
New message        Pt is having back problems and would like for a nurse to give him a call

## 2014-11-21 NOTE — Telephone Encounter (Signed)
Spoke with patient who states he continues to have back pain/problem and he wants to eliminate all possibilities of causes.  Patient asks if any of his medications could be causing him pain.  I advised patient that he could hold Crestor x 1 month to see if he notes improvement in back pain.  We discussed his cholesterol levels and the other agents he takes for hyperlipidemia.  I assured patient that holding the Crestor for 1 month would likely not send LDL levels above desired range.  Patient describes back pain as worst after sitting for any amount of time and standing up again.  I advised him that Crestor would likely cause pain that would likely be consistent in nature.  I advised him to call back to report results of therapy in 1 month.  Patient verbalized understanding and agreement.

## 2014-11-22 ENCOUNTER — Ambulatory Visit
Admission: RE | Admit: 2014-11-22 | Discharge: 2014-11-22 | Disposition: A | Discharge status: Home / Self Care | Payer: BLUE CROSS/BLUE SHIELD | Source: Ambulatory Visit | Attending: Sports Medicine | Admitting: Sports Medicine

## 2014-11-22 ENCOUNTER — Telehealth: Payer: Self-pay | Admitting: Cardiovascular Disease

## 2014-11-22 VITALS — BP 131/68 | HR 68

## 2014-11-22 DIAGNOSIS — M5410 Radiculopathy, site unspecified: Secondary | ICD-10-CM

## 2014-11-22 DIAGNOSIS — M5442 Lumbago with sciatica, left side: Secondary | ICD-10-CM

## 2014-11-22 MED ORDER — IOHEXOL 180 MG/ML  SOLN
1.0000 mL | Freq: Once | INTRAMUSCULAR | Status: AC | PRN
  Administered 2014-11-22: 1 mL via EPIDURAL

## 2014-11-22 MED ORDER — DIAZEPAM 5 MG PO TABS
5.0000 mg | ORAL_TABLET | Freq: Once | ORAL | Status: AC
  Administered 2014-11-22: 5 mg via ORAL

## 2014-11-22 MED ORDER — METHYLPREDNISOLONE ACETATE 40 MG/ML INJ SUSP (RADIOLOG
120.0000 mg | Freq: Once | INTRAMUSCULAR | Status: AC
  Administered 2014-11-22: 120 mg via EPIDURAL

## 2014-11-22 NOTE — Telephone Encounter (Signed)
Left message for patient to call me at the office 

## 2014-11-22 NOTE — Telephone Encounter (Signed)
New message      Ask a nurse a question about a medication

## 2014-11-22 NOTE — Discharge Instructions (Signed)

## 2014-11-23 ENCOUNTER — Ambulatory Visit: Payer: BLUE CROSS/BLUE SHIELD | Admitting: Sports Medicine

## 2014-11-28 ENCOUNTER — Telehealth: Payer: Self-pay | Admitting: Sports Medicine

## 2014-11-28 DIAGNOSIS — M5410 Radiculopathy, site unspecified: Secondary | ICD-10-CM

## 2014-11-28 NOTE — Telephone Encounter (Signed)
Called and spoke to pt regarding [facility].  Reports overall improvement in Urinary symptoms and left leg symptoms for the past week however now reccuring and persistent Right buttock/posterior thigh pain that is different than prior. Given overall persistent symptoms and concerns for dynamic instability discussed referall to Neurosurgery. Pt requesting to be seen by Dr. Ellene Route and referal placed as such.  Will defer to Dr. Ellene Route regarding further treatment options by ultimately pt would likely benefit from aggressive spine rehab as well.

## 2014-11-28 NOTE — Telephone Encounter (Signed)
-----   Message from Babette Relic sent at 11/28/2014  8:39 AM EDT ----- Regarding: Constant Right Leg Tingling Contact: [phone] Had injection (epidural) last Tuesday.  Used to sporadically get tingling in right leg - now it is constant.

## 2014-11-29 ENCOUNTER — Other Ambulatory Visit: Payer: Self-pay | Admitting: *Deleted

## 2014-11-29 ENCOUNTER — Telehealth: Payer: Self-pay | Admitting: Sports Medicine

## 2014-11-29 MED ORDER — TRAMADOL HCL 50 MG PO TABS: ORAL_TABLET | ORAL | Status: DC

## 2014-11-29 NOTE — Telephone Encounter (Signed)
Call about persistent LBP issues  Trial of tramadol before sitting  No change with CSI epidural

## 2014-12-01 ENCOUNTER — Telehealth: Payer: Self-pay | Admitting: *Deleted

## 2014-12-01 DIAGNOSIS — M5410 Radiculopathy, site unspecified: Secondary | ICD-10-CM

## 2014-12-01 NOTE — Telephone Encounter (Signed)
Called pt back with an appt to see Dr. Lorin Mercy for his back pain. Appt is May 24 @2 :45

## 2014-12-01 NOTE — Telephone Encounter (Signed)
-----   Message from Carolyne Littles sent at 11/30/2014  2:40 PM EDT ----- Regarding: phone message Pt would like you to call him regarding back pain and still getting a referral to for Neurosurgery?

## 2014-12-16 ENCOUNTER — Telehealth: Payer: Self-pay | Admitting: Cardiovascular Disease

## 2014-12-19 ENCOUNTER — Other Ambulatory Visit: Payer: Self-pay | Admitting: Sports Medicine

## 2014-12-19 DIAGNOSIS — M545 Low back pain: Secondary | ICD-10-CM

## 2014-12-27 ENCOUNTER — Inpatient Hospital Stay: Admission: RE | Admit: 2014-12-27 | Payer: BLUE CROSS/BLUE SHIELD | Source: Ambulatory Visit

## 2014-12-29 ENCOUNTER — Telehealth: Payer: Self-pay | Admitting: Sports Medicine

## 2014-12-29 NOTE — Telephone Encounter (Signed)
lvm for pt. I will try to call him back later tonight

## 2014-12-29 NOTE — Telephone Encounter (Signed)
-----   Message from Laurey Arrow, [place] sent at 12/29/2014 11:40 AM EDT ----- Regarding: FW: phone message Contact: [phone] Let me know what I should tell him  ----- Message -----    From: Carolyne Littles    Sent: 12/28/2014   2:01 PM      To: Laurey Arrow, RN Subject: phone message                                  Pt wants you to call him back regarding next step for his back pain? Questions about  Ordering a medial block?

## 2015-01-02 ENCOUNTER — Telehealth: Payer: Self-pay | Admitting: *Deleted

## 2015-01-02 NOTE — Telephone Encounter (Signed)
Patient called and said he would like to take the advice of Dr. Paulla Fore and see Dr. Ernestina Patches at [facility].   Let me know the exact procedure to make the referral for.

## 2015-01-04 NOTE — Telephone Encounter (Signed)
I spoke with Dr. Ernestina Patches who reported seeing Gregory Hill on 01/03/2015. They're going to be trying a facet injection . records have been requested.  I will plan to follow peripherally.

## 2015-01-04 NOTE — Telephone Encounter (Signed)
Called pt and he is happy with Dr. Ernestina Patches and will be receiving an injection from him in a week.  Said to let us know if he has any other questions.

## 2015-01-04 NOTE — Telephone Encounter (Signed)
Patient has been seen by Dr. Ernestina Patches.

## 2015-01-10 ENCOUNTER — Other Ambulatory Visit: Payer: BLUE CROSS/BLUE SHIELD

## 2015-01-18 NOTE — Telephone Encounter (Signed)
error 

## 2015-01-23 ENCOUNTER — Ambulatory Visit: Payer: BLUE CROSS/BLUE SHIELD | Admitting: Cardiovascular Disease

## 2015-02-13 ENCOUNTER — Telehealth: Payer: Self-pay | Admitting: Cardiovascular Disease

## 2015-02-13 NOTE — Telephone Encounter (Signed)
Spoke with patient who states he has an appointment in August with Dr. Acie Fredrickson but is going out of town on a long trip and would like to see him sooner if possible.  He complains of frequent cramping but no other concerns; is due for yearly follow-up.  I was able to reschedule patient with Dr. Acie Fredrickson in [place] on Thursday 7/21.  He verbalized understanding and agreement with plan.

## 2015-02-13 NOTE — Telephone Encounter (Signed)
Left message for patient to call me back. 

## 2015-02-13 NOTE — Telephone Encounter (Signed)
New Message  Pt called states that she is going away on a major trip. Req a call back to determine if there is any way that he can come in within the next two weeks.

## 2015-02-16 ENCOUNTER — Ambulatory Visit (INDEPENDENT_AMBULATORY_CARE_PROVIDER_SITE_OTHER): Payer: BLUE CROSS/BLUE SHIELD | Admitting: Cardiovascular Disease

## 2015-02-16 ENCOUNTER — Encounter: Payer: Self-pay | Admitting: Cardiovascular Disease

## 2015-02-16 VITALS — BP 116/66 | HR 67 | Ht 74.0 in | Wt 189.5 lb

## 2015-02-16 DIAGNOSIS — I4891 Unspecified atrial fibrillation: Secondary | ICD-10-CM

## 2015-02-16 DIAGNOSIS — I10 Essential (primary) hypertension: Secondary | ICD-10-CM | POA: Diagnosis not present

## 2015-02-16 DIAGNOSIS — E785 Hyperlipidemia, unspecified: Secondary | ICD-10-CM

## 2015-02-16 MED ORDER — ALPRAZOLAM 0.5 MG PO TABS
0.5000 mg | ORAL_TABLET | Freq: Every evening | ORAL | Status: DC | PRN
Start: 2015-02-16 — End: 2018-03-24

## 2015-02-16 MED ORDER — PROPRANOLOL HCL 10 MG PO TABS: 10.0000 mg | ORAL_TABLET | Freq: Four times a day (QID) | ORAL | Status: DC

## 2015-02-16 NOTE — Progress Notes (Signed)
Gregory Hill Date of Birth  [birth year] [place] [address]    [place]   [place], [place]  [address]    [place], [place]  [address] [phone]  Fax  [phone]  [phone]  Fax [phone]  Problem List: 1. Palpitations 2. Premature ventricular contractions 3. Paroxysmal atrial fibrillation 4. Dyslipidemia 5. Hypertension  History of Present Illness:  Gregory Hill is a 64 yo with a hx of palpitations, HTN, hyperlipidemia, transient atrial fibrillation.  He has a high stress job. He's noted episodes of tachycardia recently - as fast as 120 bmp.  He still walks regularly - 2-3 miles several times a week.  He has a strong family history of sudden cardiac death.  He's worn an event monitor several years ago which revealed normal sinus rhythm with occasional premature ventricular contractions.  His palpitations have improved some over the past several months. They have improved with propranolol and now is on Metoprolol 12.5 BID.  October 06, 2012:  He is doing well.  Still having some palpitations.  He is still walking regularly.   He is staying hydrated.  He is not having any problems with his palpitations.   Nov. 20, 2014:  Gregory Hill presents with persistent back pain for the past several weeks.  He has been in [place] watching his son play basketball.  He has had back pain for 3-4 weeks, associated with nausea ( especially when he has increase back / flank ache).  Does not feel like a ripping or tearing sensation.  Not worsened with twisting or turning his torso.   He has walked regularly - no CP or worsening of his back pain.   No pleuretic Cp.  Has had some leg cramping in his legs recently.  The pain acutely worsens with lying down ( increased significantly when I had him lie supine)    Jan. 12, 2015:  Gregory Hill continues to have intermittent back and chest pain.  Seems to get worse if he stands up or changes position.    Chest  CT in mid Dec. was normal.   Still exercising.  Regularly.  2-3 miles each time.    February 07, 2014:  02/16/2015: Gregory Hill is seen today for follow-up visit. He's had a history of hypertension. He's had paroxysmal atrial fibrillation in the remote past.  Has a history of hyperlipidemia and has been on Crestor. He's been having some leg cramps recently.  Gregory Hill thinks that the calf cramps are more related to his back issues and not the statin . The cramps are much better than several months .  Has had lots of back issues. Was going to have surgery but now his back is better.   His potassium levels have been borderline low.  Eats a banana every day . Drinks a V-8 occasionally.    Current Outpatient Prescriptions on File Prior to Visit  Medication Sig Dispense Refill  . aspirin 81 MG tablet Take 81 mg by mouth daily.      . cholecalciferol (VITAMIN D) 1000 UNITS tablet Take 1,000 Units by mouth daily.     . CRESTOR 10 MG tablet TAKE 1 BY MOUTH DAILY 90 tablet 1  . fenofibrate (TRICOR) 145 MG tablet TAKE 1 BY MOUTH DAILY 90 tablet 2  . metoprolol tartrate (LOPRESSOR) 25 MG tablet TAKE 1/2 BY MOUTH TWICE DAILY (LOPRESSOR) 90 tablet 1  . Multiple Vitamin (MULTIVITAMIN) tablet Take 1 tablet by mouth  daily.      . niacin (NIASPAN) 1000 MG CR tablet TAKE 1 BY MOUTH DAILY 90 tablet 3  . omega-3 acid ethyl esters (LOVAZA) 1 G capsule TAKE 3 BY MOUTH DAILY 270 capsule 2  . ramipril (ALTACE) 10 MG capsule TAKE 1 BY MOUTH TWICE DAILY 180 capsule 3   No current facility-administered medications on file prior to visit.  propranolol 10 mg 4 times a day PRN   Allergies  Allergen Reactions  . Azithromycin     As child   . Bystolic [Nebivolol Hcl]     Past Medical History  Diagnosis Date  . Hypertension   . Hyperlipidemia   . Anxiety   . Atrial fibrillation   . PVC (premature ventricular contraction)     Past Surgical History  Procedure Laterality Date  . None      History  Smoking  status  . Never Smoker   Smokeless tobacco  . Never Used    History  Alcohol Use  . 1.5 - 2.5 oz/week  . 3-5 drink(s) per week    Family History  Problem Relation Age of Onset  . Heart disease Mother   . Heart attack Mother     Reviw of Systems:  Reviewed in the HPI.  All other systems are negative.  Physical Exam: Blood pressure 116/66, pulse 67, height 6\' 2"  (1.88 m), weight 85.957 kg (189 lb 8 oz). General: Well developed, well nourished, in no acute distress.  Head: Normocephalic, atraumatic, sclera non-icteric, mucus membranes are moist  Neck: Supple. Negative for carotid bruits. JVD not elevated.  Lungs: Clear bilaterally to auscultation without wheezes, rales, or rhonchi. Breathing is normal.  Heart: RR with S1 S2. No murmurs, rubs, or gallops appreciated.  Abdomen: Soft, non-tender, non-distended with normoactive bowel sounds. No hepatomegaly. No rebound/guarding. No obvious abdominal masses.  Msk:  Strength and tone appear normal for age.  Extremities: No clubbing or cyanosis. No edema.  Distal pedal pulses are 2+ and equal bilaterally.  Neuro: Alert and oriented X 3. Moves all extremities spontaneously.  Psych:  Responds to questions appropriately with a normal affect.    ECG:  February 16, 2015:  NSR at 57.  No ST or T wave changes    Assessment / Plan:   1. Palpitations - has occasional premature atrial contractions and premature ventricular contractions. He'll continue to use Inderal on an as-needed basis. 2. Premature ventricular contractions 3. Paroxysmal atrial fibrillation - he's not had any recurrent episodes of atrial fibrillation. We discussed the fact that he would be a candidate for anticoagulation if he developed further episodes of a fibrillation. He's now 35 result and has a history of hypertension.  4. Dyslipidemia- continue aggressive treatment for his dyslipidemia. He has a low HDL.  5. Hypertension- his blood pressure is very  well-controlled. Continue current medications.   Gregory Hill, Wonda Cheng, MD  02/16/2015 2:59 PM    [place],  [place], [place]  [address] Pager [phone] Phone: [phone]; Fax: [phone]   [facility]  [address] [place], [place]  [address] [phone]   Fax [phone]

## 2015-02-16 NOTE — Patient Instructions (Addendum)
Medication Instructions:  Your physician has recommended you make the following change in your medication:  START taking propranolol 10mg  up to 4 times a day AS NEEDED for palpitations.  Xanax 0.5mg  once a day as needed  Labwork: none  Testing/Procedures: none  Follow-Up: Your physician wants you to follow-up in: one year with Dr. Acie Fredrickson in [place]. You will receive a reminder letter in the mail two months in advance. If you don't receive a letter, please call our office to schedule the follow-up appointment.   Any Other Special Instructions Will Be Listed Below (If Applicable).

## 2015-02-17 ENCOUNTER — Encounter: Payer: Self-pay | Admitting: Cardiovascular Disease

## 2015-02-20 ENCOUNTER — Other Ambulatory Visit: Payer: Self-pay | Admitting: Cardiovascular Disease

## 2015-03-08 ENCOUNTER — Telehealth: Payer: Self-pay | Admitting: Cardiovascular Disease

## 2015-03-08 NOTE — Telephone Encounter (Signed)
New Message  Pt c/o medication issue: 1. Name of Medication:Crestor  4. What is your medication issue? Pt called states that Crestor has moved to tier 3 they are suggesting the generic form Rosuvastatin. Pt requests a call back to discuss if he should stay with crestor or go to the Generic.

## 2015-03-09 NOTE — Telephone Encounter (Signed)
Follow up   Pt is calling requesting to speak to RN  And it is for direction a medication order

## 2015-03-09 NOTE — Telephone Encounter (Signed)
Spoke with patient who states his insurance carrier is requiring him to switch from brand name Crestor to generic Rosuvastatin.  He asked if it would be as effective as the brand name drug.  I advised him that the generic form of the medication is new and that I cannot speak to efficacy.  He will call me back for a Rx for the generic when he is closer to running out of his current 90 day supply.  I advised him that we will get lipid and liver levels 3 months after switching medications.  He verbalized understanding and agreement with plan of care.

## 2015-03-15 ENCOUNTER — Telehealth: Payer: Self-pay | Admitting: *Deleted

## 2015-03-15 NOTE — Telephone Encounter (Signed)
Patient c/o Palpitations:  High priority if patient c/o lightheadedness and shortness of breath.  1. How long have you been having palpitations? Last night  2. Are you currently experiencing lightheadedness and shortness of breath? no  3. Have you checked your BP and heart rate? (document readings) no  4. Are you experiencing any other symptoms? No

## 2015-03-15 NOTE — Telephone Encounter (Signed)
S/w pt who states he had a few palpitations last night for approximately one minute. Reports having no more symptoms since. Did not take propranolol. States he wanted to "wait it out". States Dr. Acie Fredrickson told him not to panic if he takes propranolol and it doesn't work. Advised pt to take propranolol if palpitations and contact us if symptoms persist. Pt verbalized understanding with no further questions.

## 2015-03-28 ENCOUNTER — Ambulatory Visit: Payer: BLUE CROSS/BLUE SHIELD | Admitting: Cardiovascular Disease

## 2015-04-11 ENCOUNTER — Other Ambulatory Visit: Payer: Self-pay | Admitting: Cardiovascular Disease

## 2015-06-05 ENCOUNTER — Telehealth: Payer: Self-pay | Admitting: Cardiovascular Disease

## 2015-06-05 MED ORDER — METOPROLOL TARTRATE 25 MG PO TABS: ORAL_TABLET | ORAL | Status: DC

## 2015-06-05 NOTE — Telephone Encounter (Signed)
Pt's request for a 7 day supply until mail order comes in has been sent to pt's pharmacy.

## 2015-06-05 NOTE — Telephone Encounter (Signed)
°*  STAT* If patient is at the pharmacy, call can be transferred to refill team.   1. Which medications need to be refilled? (please list name of each medication and dose if known) Metoprolol 25mg    2. Which pharmacy/location (including street and city if local pharmacy) is medication to be sent to?[facility] [phone].  3. Do they need a 30 day or 90 day supply? 7day supply.  Pt sent his mail order in to late and need a 7 day supply until mail order come in. Please call pt if any questions.

## 2015-06-19 ENCOUNTER — Encounter: Payer: Self-pay | Admitting: Cardiovascular Disease

## 2015-06-19 ENCOUNTER — Ambulatory Visit (INDEPENDENT_AMBULATORY_CARE_PROVIDER_SITE_OTHER): Payer: BLUE CROSS/BLUE SHIELD | Admitting: Cardiovascular Disease

## 2015-06-19 VITALS — BP 110/60 | HR 58 | Ht 74.0 in | Wt 190.4 lb

## 2015-06-19 DIAGNOSIS — R0789 Other chest pain: Secondary | ICD-10-CM | POA: Diagnosis not present

## 2015-06-19 DIAGNOSIS — E785 Hyperlipidemia, unspecified: Secondary | ICD-10-CM

## 2015-06-19 DIAGNOSIS — I10 Essential (primary) hypertension: Secondary | ICD-10-CM

## 2015-06-19 DIAGNOSIS — M25512 Pain in left shoulder: Secondary | ICD-10-CM

## 2015-06-19 DIAGNOSIS — M25519 Pain in unspecified shoulder: Secondary | ICD-10-CM | POA: Insufficient documentation

## 2015-06-19 NOTE — Patient Instructions (Signed)
Medication Instructions:  Your physician recommends that you continue on your current medications as directed. Please refer to the Current Medication list given to you today.   Labwork: None Ordered   Testing/Procedures: None Ordered   Follow-Up: Your physician recommends that you schedule a follow-up appointment in: July 2017  If you need a refill on your cardiac medications before your next appointment, please call your pharmacy.   Thank you for choosing [facility]! Christen Bame, RN [phone]

## 2015-06-19 NOTE — Progress Notes (Signed)
Gregory Hill Date of Birth  1950/08/29 [place] [address]    [place]   [place], [place]  [address]    [place], [place]  [address] [phone]  Fax  [phone]  [phone]  Fax [phone]  Problem List: 1. Palpitations 2. Premature ventricular contractions 3. Paroxysmal atrial fibrillation 4. Dyslipidemia 5. Hypertension  History of Present Illness:  Gregory Hill is a 64 yo with a hx of palpitations, HTN, hyperlipidemia, transient atrial fibrillation.  He has a high stress job. He's noted episodes of tachycardia recently - as fast as 120 bmp.  He still walks regularly - 2-3 miles several times a week.  He has a strong family history of sudden cardiac death.  He's worn an event monitor several years ago which revealed normal sinus rhythm with occasional premature ventricular contractions.  His palpitations have improved some over the past several months. They have improved with propranolol and now is on Metoprolol 12.5 BID.  October 06, 2012:  He is doing well.  Still having some palpitations.  He is still walking regularly.   He is staying hydrated.  He is not having any problems with his palpitations.   Nov. 20, 2014:  Gregory Hill presents with persistent back pain for the past several weeks.  He has been in [place] watching his son play basketball.  He has had back pain for 3-4 weeks, associated with nausea ( especially when he has increase back / flank ache).  Does not feel like a ripping or tearing sensation.  Not worsened with twisting or turning his torso.   He has walked regularly - no CP or worsening of his back pain.   No pleuretic Cp.  Has had some leg cramping in his legs recently.  The pain acutely worsens with lying down ( increased significantly when I had him lie supine)    Jan. 12, 2015:  Gregory Hill continues to have intermittent back and chest pain.  Seems to get worse if he stands up or changes position.    Chest  CT in mid Dec. was normal.   Still exercising.  Regularly.  2-3 miles each time.    February 07, 2014:  02/16/2015: Gregory Hill is seen today for follow-up visit. He's had a history of hypertension. He's had paroxysmal atrial fibrillation in the remote past.  Has a history of hyperlipidemia and has been on Crestor. He's been having some leg cramps recently.  Gregory Hill thinks that the calf cramps are more related to his back issues and not the statin . The cramps are much better than several months .  Has had lots of back issues. Was going to have surgery but now his back is better.   His potassium levels have been borderline low.  Eats a banana every day . Drinks a V-8 occasionally.  Nov. 21, 2016:  Gregory Hill is here with some left shoulder pain .  Pain radiates down left arm to wrist. Has been traveling quite a bit.    Did lot of lifting 10 days ago. Able to walk several miles without any problems .    Current Outpatient Prescriptions on File Prior to Visit  Medication Sig Dispense Refill  . ALPRAZolam (XANAX) 0.5 MG tablet Take 1 tablet (0.5 mg total) by mouth at bedtime as needed for anxiety. 0.5mg  as needed 15 tablet 0  . aspirin 81 MG tablet Take 81 mg by mouth daily.      Marland Kitchen  cholecalciferol (VITAMIN D) 1000 UNITS tablet Take 1,000 Units by mouth daily.     . CRESTOR 10 MG tablet TAKE 1 BY MOUTH DAILY 90 tablet 3  . fenofibrate (TRICOR) 145 MG tablet TAKE 1 BY MOUTH DAILY 90 tablet 3  . metoprolol tartrate (LOPRESSOR) 25 MG tablet TAKE 1/2 BY MOUTH TWICE DAILY 14 tablet 0  . Multiple Vitamin (MULTIVITAMIN) tablet Take 1 tablet by mouth daily.      . niacin (NIASPAN) 1000 MG CR tablet TAKE 1 BY MOUTH DAILY 90 tablet 3  . omega-3 acid ethyl esters (LOVAZA) 1 G capsule TAKE 3 BY MOUTH DAILY 270 capsule 3  . propranolol (INDERAL) 10 MG tablet Take 1 tablet (10 mg total) by mouth 4 (four) times daily. One tablet up to 4 times a day as needed for palpitations 30 tablet 0  . ramipril (ALTACE) 10 MG  capsule TAKE 1 BY MOUTH TWICE DAILY 180 capsule 3   No current facility-administered medications on file prior to visit.     Allergies  Allergen Reactions  . Azithromycin     As child   . Bystolic [Nebivolol Hcl]     Past Medical History  Diagnosis Date  . Hypertension   . Hyperlipidemia   . Anxiety   . Atrial fibrillation ([place])   . PVC (premature ventricular contraction)     Past Surgical History  Procedure Laterality Date  . None      History  Smoking status  . Never Smoker   Smokeless tobacco  . Never Used    History  Alcohol Use  . 1.5 - 2.5 oz/week  . 3-5 drink(s) per week    Family History  Problem Relation Age of Onset  . Heart disease Mother   . Heart attack Mother     Reviw of Systems:  Reviewed in the HPI.  All other systems are negative.  Physical Exam: Blood pressure 110/60, pulse 58, height 6\' 2"  (1.88 m), weight 190 lb 6.4 oz (86.365 kg). General: Well developed, well nourished, in no acute distress.  Head: Normocephalic, atraumatic, sclera non-icteric, mucus membranes are moist  Neck: Supple. Negative for carotid bruits. JVD not elevated.  Lungs: Clear bilaterally to auscultation without wheezes, rales, or rhonchi. Breathing is normal.  Heart: RR with S1 S2. No murmurs, rubs, or gallops appreciated.  Abdomen: Soft, non-tender, non-distended with normoactive bowel sounds.  Slight rash in his abdomen.  ? Drug rash .    Msk:  Strength and tone appear normal for age.  Extremities: No clubbing or cyanosis. No edema.  Distal pedal pulses are 2+ and equal bilaterally.  Neuro: Alert and oriented X 3. Moves all extremities spontaneously.  Psych:  Responds to questions appropriately with a normal affect.    ECG:  06/19/2015: Sinus bradycardia at 58. He has a right axis deviation. The EKG is otherwise unremarkable.   Assessment / Plan:   1. Palpitations - has occasional premature atrial contractions and premature ventricular  contractions. He'll continue to use Inderal on an as-needed basis. 2. Premature ventricular contractions 3. Paroxysmal atrial fibrillation - he's not had any recurrent episodes of atrial fibrillation. We discussed the fact that he would be a candidate for anticoagulation if he developed further episodes of a fibrillation. He's now 76 result and has a history of hypertension.  4. Dyslipidemia- continue aggressive treatment for his dyslipidemia. He has a low HDL.  5. Hypertension- his blood pressure is very well-controlled. Continue current medications.  6. Chest pain:  Gregory Hill presents today with some atypical chest and shoulder pain. His symptoms have been there for about 10 days. It's not worsened with exertion. It seems to be worsened with shoulder movement. His EKG is unremarkable. At this point I think that this is a musculoskeletal-noncardiac chest pain. He'll keep his appointment in July. He'll call sooner if he has any problems.   Kamaree Wheatley, Wonda Cheng, MD  06/19/2015 2:06 PM    [place],  [place], [place]  [address] Pager [phone] Phone: [phone]; Fax: [phone]   [facility]  [address] [place], [place]  [address] [phone]   Fax [phone]

## 2015-06-26 ENCOUNTER — Telehealth: Payer: Self-pay | Admitting: Cardiovascular Disease

## 2015-06-26 DIAGNOSIS — R55 Syncope and collapse: Secondary | ICD-10-CM

## 2015-06-26 DIAGNOSIS — M79602 Pain in left arm: Secondary | ICD-10-CM

## 2015-06-26 NOTE — Telephone Encounter (Signed)
Follow Up ° °Pt returned call//  °

## 2015-06-26 NOTE — Telephone Encounter (Signed)
Spoke with patient who reports that over the weekend he had episode of light-headedness while sitting down and he felt like might pass out; states heart rate was in the mid to low 50's bpm.  He states he noticed some heart pounding when he laid down in bed that night and that he continues to notice the arm pain.  States he reduced his dose of Metoprolol for 2 days and felt better on Sunday and feels back to normal today.  States today he is back to regular dose of Metoprolol 12.5 mg twice daily.  He denies any other changes in his physical condition, including dehydration, cold or viral symptoms, recent change in activity or diet.  I advised him that Dr. Acie Fredrickson is in the office today and advised that we could place an event monitor on him.  He states he will monitor his symptoms and heart rate this week (states he is not traveling out of town this week for business), and will call back to report.  I advised him that Dr. Acie Fredrickson is in the office all week and to call back with any questions or concerns.

## 2015-06-26 NOTE — Telephone Encounter (Signed)
Left message for patient to call back  

## 2015-06-26 NOTE — Telephone Encounter (Signed)
New Message     Pt calling stating that his pulse got a little slower than normal (56) and he got a little light headed and he wants to know what he should do going forward. Please call back and advise.

## 2015-06-30 NOTE — Telephone Encounter (Signed)
Called and spoke with patient to see how he is feeling.  He states he has had increased blood pressure this week; states typically BP [id number] systolic over 0000000 diastolic.  States today BP 145/80 mmHg, pulse 72 bpm.  He states he continues to have intermittent left arm pain that does not seem to be associated with exertion.  He states sometimes the pain comes on with activity and sometimes it does not.  He denies SOB, chest discomfort, n/v, or diaphoresis.  States he walked for exercise today and did not notice any worsening or improvement of symptoms.  I reviewed with Dr. Acie Fredrickson who is in the office and he advised to order an exercise myoview.  I reviewed advice with patient who verbalized understanding and agreement.  I advised him that we will call him back Monday to schedule.  I reviewed s/s that would prompt him to call 911 or go to the ER and also advised that he can call the on call provider anytime this weekend.  He verbalized understanding and agreement with plan.

## 2015-07-03 ENCOUNTER — Telehealth: Payer: Self-pay | Admitting: Cardiovascular Disease

## 2015-07-03 NOTE — Addendum Note (Signed)
Addended by: Emmaline Life on: 07/03/2015 08:27 AM   Modules accepted: Orders

## 2015-07-03 NOTE — Telephone Encounter (Signed)
Follow Up  ° °Pt returned the call  °

## 2015-07-03 NOTE — Telephone Encounter (Signed)
Spoke with pt and he states that he spoke with [place] on Friday and she said someone would be calling to schedule his Myoview. Pt states that he is good with any day and any time.  Advised pt that I will route a message to the scheduler for these procedures and have her follow up with an appt. Pt verbalized understanding.

## 2015-07-04 NOTE — Telephone Encounter (Signed)
Follow up  Pt wants rn to return call about scheduling his Myoview.   Pt states that he spoke to rn about his arm pain   Please call pt

## 2015-07-04 NOTE — Telephone Encounter (Signed)
Agree with note from Michelle Swinyer, RN  

## 2015-07-04 NOTE — Telephone Encounter (Signed)
Spoke with patient who states he received call for myoview which is scheduled for Tuesday 12/13.  He states he called to further describe the pain in his left arm and whether or not we feel it is cardiac in nature.  He states he is experiencing intermittent sharp pain that does not radiate.  States the pain seems to be located primarily in the upper left shoulder and he cannot determine whether or not it is positional.  He states the pain is intermittent although he cannot identify what activities make it better or worse. He states it is difficult to get comfortable when he lays down to sleep at night.  I advised him to keep his appointment for the [facility] if he cannot identify that the pain is reproducible or constant.  He states he will call the orthopedist to see if he can be evaluated for possible orthopedic cause prior to the test scheduled for Tuesday.  I advised him to call back with questions or concerns.  He verbalized understanding and agreement.

## 2015-07-06 ENCOUNTER — Telehealth (HOSPITAL_COMMUNITY): Payer: Self-pay | Admitting: *Deleted

## 2015-07-06 NOTE — Telephone Encounter (Signed)
Left message on voicemail per DPR in reference to upcoming appointment scheduled on 07/06/15 at 715 with detailed instructions given per Myocardial Perfusion Study Information Sheet for the test. LM to arrive 15 minutes early, and that it is imperative to arrive on time for appointment to keep from having the test rescheduled. If you need to cancel or reschedule your appointment, please call the office within 24 hours of your appointment. Failure to do so may result in a cancellation of your appointment, and a $50 no show fee. Phone number given for call back for any questions. Hubbard Robinson, RN

## 2015-07-07 ENCOUNTER — Telehealth: Payer: Self-pay | Admitting: Cardiovascular Disease

## 2015-07-07 NOTE — Telephone Encounter (Signed)
Left message for patient to call back.  I advised in message that we will leave myoview on schedule unless he calls back to cancel.

## 2015-07-07 NOTE — Telephone Encounter (Signed)
F/u   Pt stated he has seen his ortho and it was confirmed he has shoulder pain b/c of injury but they can't explain his pulse being down.

## 2015-07-07 NOTE — Telephone Encounter (Signed)
Pt c/o BP issue: STAT if pt c/o blurred vision, one-sided weakness or slurred speech  1. What are your last 5 BP readings?  Today 147/65  Pulse 54  2. Are you having any other symptoms (ex. Dizziness, headache, blurred vision, passed out)? Pain left arm  3. What is your BP issue? high

## 2015-07-07 NOTE — Telephone Encounter (Signed)
Spoke with patient who reports BP last night was 148/60, HR 54 and he had a difficult time going to sleep.  He states left arm pain is worse when he lays down.  He states he held metoprolol this morning to see if heart rate would improve.  He states heart rate came up to 61 bpm this morning and BP was 148/80.  He has an appointment this afternoon with an orthopedist.  He states he is concerned about what to do if orthopedist advises that left arm pain is not musculoskeletal.  I advised him to call me after the appointment if pain is determined to not be musculoskeletal and Dr. Acie Fredrickson will advise what to do next.

## 2015-07-10 ENCOUNTER — Encounter: Payer: Self-pay | Admitting: Cardiovascular Disease

## 2015-07-11 ENCOUNTER — Ambulatory Visit (HOSPITAL_COMMUNITY): Payer: BLUE CROSS/BLUE SHIELD | Attending: Cardiology

## 2015-07-11 DIAGNOSIS — I10 Essential (primary) hypertension: Secondary | ICD-10-CM | POA: Diagnosis not present

## 2015-07-11 DIAGNOSIS — R42 Dizziness and giddiness: Secondary | ICD-10-CM | POA: Insufficient documentation

## 2015-07-11 DIAGNOSIS — M79602 Pain in left arm: Secondary | ICD-10-CM | POA: Insufficient documentation

## 2015-07-11 DIAGNOSIS — Z8249 Family history of ischemic heart disease and other diseases of the circulatory system: Secondary | ICD-10-CM | POA: Insufficient documentation

## 2015-07-11 DIAGNOSIS — R079 Chest pain, unspecified: Secondary | ICD-10-CM | POA: Insufficient documentation

## 2015-07-11 DIAGNOSIS — R55 Syncope and collapse: Secondary | ICD-10-CM | POA: Insufficient documentation

## 2015-07-11 DIAGNOSIS — R002 Palpitations: Secondary | ICD-10-CM | POA: Insufficient documentation

## 2015-07-11 LAB — MYOCARDIAL PERFUSION IMAGING
CHL CUP MPHR: 154 {beats}/min
CHL RATE OF PERCEIVED EXERTION: 17
CSEPEDS: 0 s
CSEPEW: 10.1 METS
Exercise duration (min): 9 min
LVDIAVOL: 115 mL
LVSYSVOL: 40 mL
Peak HR: 136 {beats}/min
Percent HR: 88 %
RATE: 0.34
Rest HR: 60 {beats}/min
SDS: 1
SRS: 2
SSS: 3
TID: 0.94

## 2015-07-11 MED ORDER — TECHNETIUM TC 99M SESTAMIBI GENERIC - CARDIOLITE
31.4000 | Freq: Once | INTRAVENOUS | Status: AC | PRN
  Administered 2015-07-11: 31 via INTRAVENOUS

## 2015-07-11 MED ORDER — TECHNETIUM TC 99M SESTAMIBI GENERIC - CARDIOLITE
10.4000 | Freq: Once | INTRAVENOUS | Status: AC | PRN
  Administered 2015-07-11: 10 via INTRAVENOUS

## 2015-07-11 NOTE — Telephone Encounter (Signed)
Patient arrived for myocardial perfusion study

## 2015-07-12 ENCOUNTER — Telehealth: Payer: Self-pay | Admitting: Cardiovascular Disease

## 2015-07-12 NOTE — Telephone Encounter (Signed)
Spoke with patient who c/o very sharp pain in left calf last night that lasted 1-2 seconds, occurred twice.  He denies cramping sensation or pain r/t past history of back pain and sciatica.  He states his potassium level has always been normal and that he continues to consume a good amount of dietary K+.  He does not think that it needs to be rechecked at this time.  He asks if this sounds like symptoms of a blood clot.  He denies Homan's sign, swelling, or discoloration in the left leg.  He has not traveled recently and is active everyday.  I advised him to continue to monitor and to call back with questions or concerns.  He verbalized understanding and agreement.

## 2015-07-12 NOTE — Telephone Encounter (Signed)
New MEssage  Pt calling to speak w/ RN- pt c/o of sharp/short pain in left calf- pt stated it lasted for about 1 second and occurred twice last night. Please call back and discuss.

## 2015-07-12 NOTE — Telephone Encounter (Signed)
I agree with note from Christen Bame, RN. Symptoms do not sound like a DVT.  Continue to observe

## 2015-11-21 ENCOUNTER — Telehealth: Payer: Self-pay | Admitting: Cardiovascular Disease

## 2015-11-21 NOTE — Telephone Encounter (Signed)
Spoke with patient who states he has been having episodes of irregular heart rate recently, calls it "skipped beats."  He states he recently skipped a dose of the lopressor due to his heart rate being in the 50's bpm.  He states he possibly felt more skipped beats after doing so.  He denies chest discomfort and SOB.  States he had some very short episodes of light headedness but it did not keep him from doing his activities.  He has a history of transient atrial fib. He has not been monitoring his BP but reports it has been normal at recent doctor's visits.  I offered him an appointment with Dr. Acie Fredrickson for tomorrow.  He verbalized agreement and appreciation for appointment tomorrow.

## 2015-11-21 NOTE — Telephone Encounter (Signed)
New message  Patient calling    Patient c/o Palpitations:  High priority if patient c/o lightheadedness and shortness of breath.  1. How long have you been having palpitations? Have them general rule - more often 60-40 sec   2. Are you currently experiencing lightheadedness and shortness of breath? No   3. Have you checked your BP and heart rate? (document readings) hr 60   4. Are you experiencing any other symptoms? Take another medication slow heart beat down- 1/2 am  1/2 pm - metoprolol 25 mg

## 2015-11-22 ENCOUNTER — Encounter: Payer: Self-pay | Admitting: Cardiovascular Disease

## 2015-11-22 ENCOUNTER — Ambulatory Visit (INDEPENDENT_AMBULATORY_CARE_PROVIDER_SITE_OTHER): Payer: BLUE CROSS/BLUE SHIELD | Admitting: Cardiovascular Disease

## 2015-11-22 VITALS — BP 130/62 | HR 63 | Ht 74.0 in | Wt 186.4 lb

## 2015-11-22 DIAGNOSIS — I493 Ventricular premature depolarization: Secondary | ICD-10-CM

## 2015-11-22 DIAGNOSIS — I4891 Unspecified atrial fibrillation: Secondary | ICD-10-CM

## 2015-11-22 DIAGNOSIS — E785 Hyperlipidemia, unspecified: Secondary | ICD-10-CM

## 2015-11-22 LAB — HEPATIC FUNCTION PANEL
ALT: 18 U/L (ref 9–46)
AST: 23 U/L (ref 10–35)
Albumin: 3.9 g/dL (ref 3.6–5.1)
Alkaline Phosphatase: 25 U/L — ABNORMAL LOW (ref 40–115)
Bilirubin, Direct: 0.2 mg/dL
Indirect Bilirubin: 0.5 mg/dL (ref 0.2–1.2)
Total Bilirubin: 0.7 mg/dL (ref 0.2–1.2)
Total Protein: 6.1 g/dL (ref 6.1–8.1)

## 2015-11-22 LAB — COMPREHENSIVE METABOLIC PANEL WITH GFR
ALT: 18 U/L (ref 9–46)
AST: 23 U/L (ref 10–35)
Albumin: 3.9 g/dL (ref 3.6–5.1)
Alkaline Phosphatase: 25 U/L — ABNORMAL LOW (ref 40–115)
BUN: 14 mg/dL (ref 7–25)
CO2: 27 mmol/L (ref 20–31)
Calcium: 9.1 mg/dL (ref 8.6–10.3)
Chloride: 106 mmol/L (ref 98–110)
Creat: 0.98 mg/dL (ref 0.70–1.25)
Glucose, Bld: 96 mg/dL (ref 65–99)
Potassium: 4 mmol/L (ref 3.5–5.3)
Sodium: 140 mmol/L (ref 135–146)
Total Bilirubin: 0.7 mg/dL (ref 0.2–1.2)
Total Protein: 6.1 g/dL (ref 6.1–8.1)

## 2015-11-22 LAB — LIPID PANEL
Cholesterol: 99 mg/dL — ABNORMAL LOW (ref 125–200)
HDL: 27 mg/dL — ABNORMAL LOW
LDL Cholesterol: 60 mg/dL
Total CHOL/HDL Ratio: 3.7 ratio
Triglycerides: 61 mg/dL
VLDL: 12 mg/dL

## 2015-11-22 NOTE — Addendum Note (Signed)
Addended by: Tamsen Snider on: 11/22/2015 09:43 AM   Modules accepted: Orders

## 2015-11-22 NOTE — Progress Notes (Signed)
Gregory Hill Date of Birth  10/23/1950 [place] [address]    [place]   [place], [place]  [address]    [name], [place]  [address] [phone]  Fax  [phone]  [phone]  Fax [phone]  Problem List: 1. Palpitations 2. Premature ventricular contractions 3. Paroxysmal atrial fibrillation 4. Dyslipidemia 5. Hypertension  History of Present Illness:  Gregory Hill is a 65 yo with a hx of palpitations, HTN, hyperlipidemia, transient atrial fibrillation.  He has a high stress job. He's noted episodes of tachycardia recently - as fast as 120 bmp.  He still walks regularly - 2-3 miles several times a week.  He has a strong family history of sudden cardiac death.  He's worn an event monitor several years ago which revealed normal sinus rhythm with occasional premature ventricular contractions.  His palpitations have improved some over the past several months. They have improved with propranolol and now is on Metoprolol 12.5 BID.  October 06, 2012:  He is doing well.  Still having some palpitations.  He is still walking regularly.   He is staying hydrated.  He is not having any problems with his palpitations.   Nov. 20, 2014:  Gregory Hill presents with persistent back pain for the past several weeks.  He has been in [place] watching his son play basketball.  He has had back pain for 3-4 weeks, associated with nausea ( especially when he has increase back / flank ache).  Does not feel like a ripping or tearing sensation.  Not worsened with twisting or turning his torso.   He has walked regularly - no CP or worsening of his back pain.   No pleuretic Cp.  Has had some leg cramping in his legs recently.  The pain acutely worsens with lying down ( increased significantly when I had him lie supine)    Jan. 12, 2015:  Gregory Hill continues to have intermittent back and chest pain.  Seems to get worse if he stands up or changes position.    Chest  CT in mid Dec. was normal.   Still exercising.  Regularly.  2-3 miles each time.    February 07, 2014:  02/16/2015: Gregory Hill is seen today for follow-up visit. He's had a history of hypertension. He's had paroxysmal atrial fibrillation in the remote past.  Has a history of hyperlipidemia and has been on Crestor. He's been having some leg cramps recently.  Gregory Hill thinks that the calf cramps are more related to his back issues and not the statin . The cramps are much better than several months .  Has had lots of back issues. Was going to have surgery but now his back is better.   His potassium levels have been borderline low.  Eats a banana every day . Drinks a V-8 occasionally.  Nov. 21, 2016:  Gregory Hill is here with some left shoulder pain .  Pain radiates down left arm to wrist. Has been traveling quite a bit.    Did lot of lifting 10 days ago. Able to walk several miles without any problems .   November 22, 2015:  Gregory Hill is here to discuss more palpitations ? Due to allergies.  Has some skipped beats.   Goes away.   Then recurs several hours later.  Sometime occurs after eating  Or after lying down in bed at the end of the day  HR has been slower  Current Outpatient Prescriptions on File Prior to Visit  Medication Sig Dispense Refill  . ALPRAZolam (XANAX) 0.5 MG tablet Take 1 tablet (0.5 mg total) by mouth at bedtime as needed for anxiety. 0.5mg  as needed 15 tablet 0  . aspirin 81 MG tablet Take 81 mg by mouth daily.      . cholecalciferol (VITAMIN D) 1000 UNITS tablet Take 1,000 Units by mouth daily.     . Multiple Vitamin (MULTIVITAMIN) tablet Take 1 tablet by mouth daily.      . propranolol (INDERAL) 10 MG tablet Take 1 tablet (10 mg total) by mouth 4 (four) times daily. One tablet up to 4 times a day as needed for palpitations 30 tablet 0   No current facility-administered medications on file prior to visit.     Allergies  Allergen Reactions  . Azithromycin     As child   .  Bystolic [Nebivolol Hcl]     Past Medical History  Diagnosis Date  . Hypertension   . Hyperlipidemia   . Anxiety   . Atrial fibrillation ([place])   . PVC (premature ventricular contraction)     Past Surgical History  Procedure Laterality Date  . None      History  Smoking status  . Never Smoker   Smokeless tobacco  . Never Used    History  Alcohol Use  . 1.5 - 2.5 oz/week  . 3-5 drink(s) per week    Family History  Problem Relation Age of Onset  . Heart disease Mother   . Heart attack Mother     Reviw of Systems:  Reviewed in the HPI.  All other systems are negative.  Physical Exam: Blood pressure 130/62, pulse 63, height 6\' 2"  (1.88 m), weight 186 lb 6.4 oz (84.55 kg), SpO2 99 %. General: Well developed, well nourished, in no acute distress.  Head: Normocephalic, atraumatic, sclera non-icteric, mucus membranes are moist  Neck: Supple. Negative for carotid bruits. JVD not elevated.  Lungs: Clear bilaterally to auscultation without wheezes, rales, or rhonchi. Breathing is normal.  Heart: RR with S1 S2. No murmurs, rubs, or gallops appreciated.  Abdomen: Soft, non-tender, non-distended with normoactive bowel sounds.      Msk:  Strength and tone appear normal for age.  Extremities: No clubbing or cyanosis. No edema.  Distal pedal pulses are 2+ and equal bilaterally.  Neuro: Alert and oriented X 3. Moves all extremities spontaneously.  Psych:  Responds to questions appropriately with a normal affect.    ECG:  11/22/2015 NSR at 60.  RAD, normal  ECG     Assessment / Plan:   1. Palpitations - has occasional premature atrial contractions and premature ventricular contractions. He'll continue to use Inderal on an as-needed basis.  i've recommended that he try a V-8 juice on occasion   2. Premature ventricular contractions 3. Paroxysmal atrial fibrillation - he's not had any recurrent episodes of atrial fibrillation. We discussed the fact that he would  be a candidate for anticoagulation if he developed further episodes of a fibrillation. He's now 36 result and has a history of hypertension.  4. Dyslipidemia- continue aggressive treatment for his dyslipidemia. He has a low HDL. Check labs today   5. Hypertension- his blood pressure is very well-controlled. Continue current medications.     Nahser, Wonda Cheng, MD  11/22/2015 8:48 AM    [address] [place],  [place], [place]  [address] Pager [phone] Phone: [phone]; Fax: (  [phone]   [facility]  [address] [place], [place]  [address] [phone]   Fax [phone]

## 2015-11-22 NOTE — Patient Instructions (Signed)
Medication Instructions:  Your physician recommends that you continue on your current medications as directed. Please refer to the Current Medication list given to you today.   Labwork: Your physician recommends that you have lab work today: cmet/lft/lipid   Testing/Procedures: -None  Follow-Up: Your physician wants you to follow-up in: 6 months with Dr. Acie Fredrickson. You will receive a reminder letter in the mail two months in advance. If you don't receive a letter, please call our office to schedule the follow-up appointment.   Any Other Special Instructions Will Be Listed Below (If Applicable).     If you need a refill on your cardiac medications before your next appointment, please call your pharmacy.

## 2015-12-11 ENCOUNTER — Telehealth: Payer: Self-pay | Admitting: Cardiovascular Disease

## 2015-12-11 DIAGNOSIS — R002 Palpitations: Secondary | ICD-10-CM

## 2015-12-11 NOTE — Telephone Encounter (Signed)
Talked to Gregory Hill He continues to have PVCs.  Typically occur when he is relaxing. Never occurs when he is active or walking . 1.   Will refill his propranolol 10 mg 4 times a day PRN palpitations to [facility] - on [address]    2. Echo  - premature ventricular contractions. Hx of PAF

## 2015-12-11 NOTE — Telephone Encounter (Signed)
Gregory Hill is calling because he is still having PVC's and is wanting to speak with Dr. Acie Fredrickson if possible .Marland Kitchen Please Call

## 2015-12-12 MED ORDER — PROPRANOLOL HCL 10 MG PO TABS: 10.0000 mg | ORAL_TABLET | Freq: Four times a day (QID) | ORAL | Status: DC

## 2015-12-12 NOTE — Telephone Encounter (Signed)
Order placed for echo and propranolol refill sent to patient's pharmacy

## 2015-12-18 ENCOUNTER — Telehealth: Payer: Self-pay | Admitting: Cardiovascular Disease

## 2015-12-18 DIAGNOSIS — I480 Paroxysmal atrial fibrillation: Secondary | ICD-10-CM

## 2015-12-18 DIAGNOSIS — R002 Palpitations: Secondary | ICD-10-CM

## 2015-12-18 NOTE — Telephone Encounter (Signed)
New Message: Pt is calling in to wanting to discuss some new developments with his irregular heart beat. He says that now before he goes to bed he gets a rapid heart beat for about 3-4 seconds. It concerns him and would like to further discuss this with Sharyn Lull. Please f/u with pt

## 2015-12-18 NOTE — Telephone Encounter (Signed)
Spoke with patient who states he has experienced rapid heart rate at bedtime on 2 occasions in the last 2 days.  He states he was rolling over to his right side when these episodes occurred.  He did not take propranolol because the episodes both resolved quickly; states they each lasted just a few seconds.  He has a hx of PAF several years ago and was cardioverted at that time.  He states he does not know if the episodes from this weekend were PVC's which he is experiencing regularly or PAF.  He states he is feeling well today.  I advised him that he will likely need a monitor to determine what he is experiencing.  I advised that Dr. Acie Fredrickson is in the office today and is willing to see him for an appointment in the next few days.  He states he can come in any time this week except Thursday morning or anytime Friday.  He would like to get the monitor at the time of the visit.  I advised him that I coordinate the 2 schedules and call him back with appointments.  He verbalized understanding and agreement with plan.

## 2015-12-18 NOTE — Telephone Encounter (Signed)
Spoke with patient and advised him of appointments Wednesday with Dr. Acie Fredrickson and for monitor if it is needed.  He verbalized understanding and agreement and thanked me for the call.

## 2015-12-20 ENCOUNTER — Telehealth: Payer: Self-pay | Admitting: Cardiovascular Disease

## 2015-12-20 ENCOUNTER — Ambulatory Visit: Payer: BLUE CROSS/BLUE SHIELD | Admitting: Cardiovascular Disease

## 2015-12-20 NOTE — Telephone Encounter (Signed)
Spoke with patient who states his symptoms have improved and he cancelled appointments for today.  He states he will call back if symptoms return or if he has any questions or concerns.  He thanked me for the call.

## 2015-12-20 NOTE — Telephone Encounter (Signed)
New message    Pt called and verbal;ized that she wants to inform Dr.Nasher and Sharyn Lull that   His PVC has eased and he dont have any right now and pt wants to cancel appt  He said feel free to call if an appt needs to be rescheduled

## 2015-12-22 ENCOUNTER — Ambulatory Visit: Payer: BLUE CROSS/BLUE SHIELD

## 2015-12-22 ENCOUNTER — Ambulatory Visit (INDEPENDENT_AMBULATORY_CARE_PROVIDER_SITE_OTHER): Payer: BLUE CROSS/BLUE SHIELD | Admitting: Cardiovascular Disease

## 2015-12-22 ENCOUNTER — Encounter: Payer: Self-pay | Admitting: Cardiovascular Disease

## 2015-12-22 VITALS — BP 112/66 | HR 52 | Ht 74.0 in | Wt 185.1 lb

## 2015-12-22 DIAGNOSIS — I10 Essential (primary) hypertension: Secondary | ICD-10-CM

## 2015-12-22 DIAGNOSIS — I480 Paroxysmal atrial fibrillation: Secondary | ICD-10-CM

## 2015-12-22 DIAGNOSIS — I493 Ventricular premature depolarization: Secondary | ICD-10-CM

## 2015-12-22 NOTE — Patient Instructions (Signed)

## 2015-12-22 NOTE — Progress Notes (Signed)
[name] Date of Birth  06-30-1951 [place] [address]    [place]   [place], [place]  [address]    [place], [place]  [address] [phone]  Fax  [phone]  [phone]  Fax [phone]  Problem List: 1. Palpitations 2. Premature ventricular contractions 3. Paroxysmal atrial fibrillation 4. Dyslipidemia 5. Hypertension  History of Present Illness:  Gregory Hill is a 65 yo with a hx of palpitations, HTN, hyperlipidemia, transient atrial fibrillation.  He has a high stress job. He's noted episodes of tachycardia recently - as fast as 120 bmp.  He still walks regularly - 2-3 miles several times a week.  He has a strong family history of sudden cardiac death.  He's worn an event monitor several years ago which revealed normal sinus rhythm with occasional premature ventricular contractions.  His palpitations have improved some over the past several months. They have improved with propranolol and now is on Metoprolol 12.5 BID.  October 06, 2012:  He is doing well.  Still having some palpitations.  He is still walking regularly.   He is staying hydrated.  He is not having any problems with his palpitations.   Nov. 20, 2014:  Gregory Hill presents with persistent back pain for the past several weeks.  He has been in [place] watching his son play basketball.  He has had back pain for 3-4 weeks, associated with nausea ( especially when he has increase back / flank ache).  Does not feel like a ripping or tearing sensation.  Not worsened with twisting or turning his torso.   He has walked regularly - no CP or worsening of his back pain.   No pleuretic Cp.  Has had some leg cramping in his legs recently.  The pain acutely worsens with lying down ( increased significantly when I had him lie supine)    Jan. 12, 2015:  Gregory Hill continues to have intermittent back and chest pain.  Seems to get worse if he stands up or changes position.    Chest  CT in mid Dec. was normal.   Still exercising.  Regularly.  2-3 miles each time.    February 07, 2014:  02/16/2015: Gregory Hill is seen today for follow-up visit. He's had a history of hypertension. He's had paroxysmal atrial fibrillation in the remote past.  Has a history of hyperlipidemia and has been on Crestor. He's been having some leg cramps recently.  Gregory Hill thinks that the calf cramps are more related to his back issues and not the statin . The cramps are much better than several months .  Has had lots of back issues. Was going to have surgery but now his back is better.   His potassium levels have been borderline low.  Eats a banana every day . Drinks a V-8 occasionally.  Nov. 21, 2016:  Gregory Hill is here with some left shoulder pain .  Pain radiates down left arm to wrist. Has been traveling quite a bit.    Did lot of lifting 10 days ago. Able to walk several miles without any problems .   November 22, 2015:  Gregory Hill is here to discuss more palpitations ? Due to allergies.  Has some skipped beats.   Goes away.   Then recurs several hours later.  Sometime occurs after eating  Or after lying down in bed at the end of the day  HR has been slower   Dec 22, 2015:  Gregory Hill is here for a work in visit for more palpitations. Remembered today that he had forgotten to take his metoprolol that day .   Walks 3 miles a day without problems.   Has PVCs - more when he eats.  More when he is sitting quitely at night.  Takes propropralol on occasion Has also had a brief run of tachycardia .  Lasts only 2-3 seconds  Does not want to wear a monitor .   Current Outpatient Prescriptions on File Prior to Visit  Medication Sig Dispense Refill  . ALPRAZolam (XANAX) 0.5 MG tablet Take 1 tablet (0.5 mg total) by mouth at bedtime as needed for anxiety. 0.5mg  as needed 15 tablet 0  . aspirin 81 MG tablet Take 81 mg by mouth daily.      . cholecalciferol (VITAMIN D) 1000 UNITS tablet Take 1,000 Units by mouth  daily.     . fenofibrate (TRICOR) 145 MG tablet Take 145 mg by mouth daily.    . metoprolol tartrate (LOPRESSOR) 25 MG tablet Take 12.5 mg by mouth 2 (two) times daily.    . Multiple Vitamin (MULTIVITAMIN) tablet Take 1 tablet by mouth daily.      . niacin (NIASPAN) 1000 MG CR tablet Take 1,000 mg by mouth at bedtime.    Marland Kitchen omega-3 acid ethyl esters (LOVAZA) 1 g capsule Take 3 g by mouth daily.    . propranolol (INDERAL) 10 MG tablet Take 1 tablet (10 mg total) by mouth 4 (four) times daily. One tablet up to 4 times a day as needed for palpitations 60 tablet 11  . ramipril (ALTACE) 10 MG capsule Take 10 mg by mouth daily.    . rosuvastatin (CRESTOR) 10 MG tablet Take 10 mg by mouth daily.     No current facility-administered medications on file prior to visit.     Allergies  Allergen Reactions  . Azithromycin     As child   . Bystolic [Nebivolol Hcl]     Past Medical History  Diagnosis Date  . Hypertension   . Hyperlipidemia   . Anxiety   . Atrial fibrillation ([place])   . PVC (premature ventricular contraction)     Past Surgical History  Procedure Laterality Date  . None      History  Smoking status  . Never Smoker   Smokeless tobacco  . Never Used    History  Alcohol Use  . 1.5 - 2.5 oz/week  . 3-5 drink(s) per week    Family History  Problem Relation Age of Onset  . Heart disease Mother   . Heart attack Mother     Reviw of Systems:  Reviewed in the HPI.  All other systems are negative.  Physical Exam: Blood pressure 112/66, pulse 52, height 6\' 2"  (1.88 m), weight 185 lb 1.9 oz (83.97 kg). General: Well developed, well nourished, in no acute distress.  Head: Normocephalic, atraumatic, sclera non-icteric, mucus membranes are moist  Neck: Supple. Negative for carotid bruits. JVD not elevated.  Lungs: Clear bilaterally to auscultation without wheezes, rales, or rhonchi. Breathing is normal.  Heart: RR with S1 S2. No murmurs, rubs, or gallops   Abdomen:  Soft, non-tender, non-distended with normoactive bowel sounds.   Msk:  Strength and tone appear normal for age.  Extremities: No clubbing or cyanosis. No edema.  Distal pedal pulses are 2+ and equal bilaterally.  Neuro: Alert and oriented X 3. Moves all extremities spontaneously.  Psych:  Responds to questions appropriately with  a normal affect.    ECG: 12/22/2015 Sinus brady at 52.   Marland Kitchen  RAD, normal  ECG Assessment / Plan:   1. Palpitations - has occasional premature atrial contractions and premature ventricular contractions. He'll continue to use Inderal on an as-needed basis.  i've recommended that he try a V-8 juice on occasion   2. Premature ventricular contractions - worsened when he forgot his metoprolol .    Better now. Will get an echo soon.   3. Paroxysmal atrial fibrillation - he's not had any recurrent episodes of atrial fibrillation. We discussed the fact that he would be a candidate for anticoagulation if he developed further episodes of a fibrillation. He's now 76 result and has a history of hypertension.  4. Dyslipidemia- continue aggressive treatment for his dyslipidemia. He has a low HDL. Check labs today   5. Hypertension- his blood pressure is very well-controlled. Continue current medications.  Mertie Moores, MD  12/22/2015 10:36 AM    [place],  [place], [place]  [address] Pager [phone] Phone: [phone]; Fax: [phone]   [facility]  [address] [place], [place]  [address] [phone]   Fax [phone]

## 2015-12-28 ENCOUNTER — Ambulatory Visit (HOSPITAL_COMMUNITY): Payer: BLUE CROSS/BLUE SHIELD | Attending: Cardiology

## 2015-12-28 ENCOUNTER — Other Ambulatory Visit: Payer: Self-pay

## 2015-12-28 ENCOUNTER — Telehealth: Payer: Self-pay | Admitting: Cardiovascular Disease

## 2015-12-28 DIAGNOSIS — E785 Hyperlipidemia, unspecified: Secondary | ICD-10-CM | POA: Insufficient documentation

## 2015-12-28 DIAGNOSIS — R002 Palpitations: Secondary | ICD-10-CM

## 2015-12-28 DIAGNOSIS — I10 Essential (primary) hypertension: Secondary | ICD-10-CM | POA: Diagnosis not present

## 2015-12-28 DIAGNOSIS — I351 Nonrheumatic aortic (valve) insufficiency: Secondary | ICD-10-CM | POA: Insufficient documentation

## 2015-12-28 DIAGNOSIS — Z8249 Family history of ischemic heart disease and other diseases of the circulatory system: Secondary | ICD-10-CM | POA: Insufficient documentation

## 2015-12-28 NOTE — Telephone Encounter (Signed)
New Message  Pt saw results of Echo in [place] but had additional question for RN. Please call back and discuss.

## 2015-12-28 NOTE — Telephone Encounter (Signed)
Reviewed echo results with patient and answered questions to his satisfaction. I advised him to call back with questions or concerns.   He thanked me for the call.

## 2016-02-16 ENCOUNTER — Ambulatory Visit: Payer: BLUE CROSS/BLUE SHIELD | Admitting: Cardiovascular Disease

## 2016-02-26 ENCOUNTER — Other Ambulatory Visit: Payer: Self-pay | Admitting: Cardiovascular Disease

## 2016-04-04 ENCOUNTER — Other Ambulatory Visit: Payer: Self-pay | Admitting: Cardiovascular Disease

## 2016-04-17 ENCOUNTER — Other Ambulatory Visit: Payer: Self-pay | Admitting: *Deleted

## 2016-04-17 DIAGNOSIS — E785 Hyperlipidemia, unspecified: Secondary | ICD-10-CM

## 2016-04-17 MED ORDER — OMEGA-3-ACID ETHYL ESTERS 1 G PO CAPS: 3.0000 g | ORAL_CAPSULE | Freq: Every day | ORAL | 2 refills | Status: DC

## 2016-05-27 ENCOUNTER — Encounter: Payer: Self-pay | Admitting: Cardiovascular Disease

## 2016-05-27 ENCOUNTER — Ambulatory Visit (INDEPENDENT_AMBULATORY_CARE_PROVIDER_SITE_OTHER): Payer: BLUE CROSS/BLUE SHIELD | Admitting: Cardiovascular Disease

## 2016-05-27 ENCOUNTER — Other Ambulatory Visit: Payer: Self-pay | Admitting: Cardiovascular Disease

## 2016-05-27 VITALS — BP 100/80 | HR 60 | Ht 74.0 in | Wt 186.8 lb

## 2016-05-27 DIAGNOSIS — I10 Essential (primary) hypertension: Secondary | ICD-10-CM | POA: Diagnosis not present

## 2016-05-27 DIAGNOSIS — I493 Ventricular premature depolarization: Secondary | ICD-10-CM

## 2016-05-27 DIAGNOSIS — E7800 Pure hypercholesterolemia, unspecified: Secondary | ICD-10-CM

## 2016-05-27 MED ORDER — ROSUVASTATIN CALCIUM 10 MG PO TABS: 10.0000 mg | ORAL_TABLET | Freq: Every day | ORAL | 3 refills | Status: DC

## 2016-05-27 MED ORDER — RAMIPRIL 10 MG PO CAPS: 10.0000 mg | ORAL_CAPSULE | Freq: Every day | ORAL | 3 refills | Status: DC

## 2016-05-27 NOTE — Patient Instructions (Addendum)
Try drinking a V8 juice daily or every other day. This will probably help with your leg cramps and also the PVCs.  Medication Instructions:  Your physician recommends that you continue on your current medications as directed. Please refer to the Current Medication list given to you today.   Labwork: None Ordered   Testing/Procedures: None Ordered   Follow-Up: Your physician wants you to follow-up in: 6 months with Dr. Acie Fredrickson.  You will receive a reminder letter in the mail two months in advance. If you don't receive a letter, please call our office to schedule the follow-up appointment.   If you need a refill on your cardiac medications before your next appointment, please call your pharmacy.   Thank you for choosing [facility]! Christen Bame, RN [phone]

## 2016-05-27 NOTE — Progress Notes (Signed)
Gregory Hill Date of Birth  09-Jun-1951 [place]  [address]    [place]      [place], [place]  [address]            Problem List: 1. Palpitations 2. Premature ventricular contractions 3. Paroxysmal atrial fibrillation 4. Dyslipidemia 5. Hypertension     Gregory Hill is a 65 yo with a hx of palpitations, HTN, hyperlipidemia, transient atrial fibrillation.  He has a high stress job. He's noted episodes of tachycardia recently - as fast as 120 bmp.  He still walks regularly - 2-3 miles several times a week.  He has a strong family history of sudden cardiac death.  He's worn an event monitor several years ago which revealed normal sinus rhythm with occasional premature ventricular contractions.  His palpitations have improved some over the past several months. They have improved with propranolol and now is on Metoprolol 12.5 BID.  October 06, 2012:  He is doing well.  Still having some palpitations.  He is still walking regularly.   He is staying hydrated.  He is not having any problems with his palpitations.   Nov. 20, 2014:  Gregory Hill presents with persistent back pain for the past several weeks.  He has been in [place] watching his son play basketball.  He has had back pain for 3-4 weeks, associated with nausea ( especially when he has increase back / flank ache).  Does not feel like a ripping or tearing sensation.  Not worsened with twisting or turning his torso.   He has walked regularly - no CP or worsening of his back pain.   No pleuretic Cp.  Has had some leg cramping in his legs recently.  The pain acutely worsens with lying down ( increased significantly when I had him lie supine)    Jan. 12, 2015:  Gregory Hill continues to have intermittent back and chest pain.  Seems to get worse if he stands up or changes position.    Chest CT in mid Dec. was normal.   Still exercising.  Regularly.  2-3 miles each time.    February 07, 2014:  02/16/2015: Gregory Hill is seen  today for follow-up visit. He's had a history of hypertension. He's had paroxysmal atrial fibrillation in the remote past.  Has a history of hyperlipidemia and has been on Crestor. He's been having some leg cramps recently.  Gregory Hill thinks that the calf cramps are more related to his back issues and not the statin . The cramps are much better than several months .  Has had lots of back issues. Was going to have surgery but now his back is better.   His potassium levels have been borderline low.  Eats a banana every day . Drinks a V-8 occasionally.  Nov. 21, 2016:  Gregory Hill is here with some left shoulder pain .  Pain radiates down left arm to wrist. Has been traveling quite a bit.    Did lot of lifting 10 days ago. Able to walk several miles without any problems .   November 22, 2015:  Gregory Hill is here to discuss more palpitations ? Due to allergies.  Has some skipped beats.   Goes away.   Then recurs several hours later.  Sometime occurs after eating  Or after lying down in bed at the end of the day  HR has been slower   Dec 22, 2015:  Gregory Hill is here for a work in  visit for more palpitations. Remembered today that he had forgotten to take his metoprolol that day .   Walks 3 miles a day without problems.   Has PVCs - more when he eats.  More when he is sitting quitely at night.  Takes propropralol on occasion Has also had a brief run of tachycardia .  Lasts only 2-3 seconds  Does not want to wear a monitor .   Oct. 30, 2017:  Feeling better.   Has some PVCs.  They seem to have calmed down. Has not been doing much exercise Had some tachycardia playing golf.    Did not feel faint .  Brought his labs from work    Current Outpatient Prescriptions on File Prior to Visit  Medication Sig Dispense Refill  . ALPRAZolam (XANAX) 0.5 MG tablet Take 1 tablet (0.5 mg total) by mouth at bedtime as needed for anxiety. 0.5mg  as needed 15 tablet 0  . aspirin 81 MG tablet Take 81 mg by mouth daily.       . cholecalciferol (VITAMIN D) 1000 UNITS tablet Take 1,000 Units by mouth daily.     . fenofibrate (TRICOR) 145 MG tablet Take 145 mg by mouth daily.    . fenofibrate (TRICOR) 145 MG tablet Take 1 tablet (145 mg total) by mouth daily. 90 tablet 2  . metoprolol tartrate (LOPRESSOR) 25 MG tablet Take 12.5 mg by mouth 2 (two) times daily.    . metoprolol tartrate (LOPRESSOR) 25 MG tablet Take 0.5 tablets (12.5 mg total) by mouth 2 (two) times daily. 90 tablet 2  . Multiple Vitamin (MULTIVITAMIN) tablet Take 1 tablet by mouth daily.      . niacin (NIASPAN) 1000 MG CR tablet Take 1,000 mg by mouth at bedtime.    Marland Kitchen omega-3 acid ethyl esters (LOVAZA) 1 g capsule Take 3 capsules (3 g total) by mouth daily. 270 capsule 2  . propranolol (INDERAL) 10 MG tablet Take 1 tablet (10 mg total) by mouth 4 (four) times daily. One tablet up to 4 times a day as needed for palpitations 60 tablet 11  . ramipril (ALTACE) 10 MG capsule Take 10 mg by mouth daily.    . rosuvastatin (CRESTOR) 10 MG tablet Take 10 mg by mouth daily.     No current facility-administered medications on file prior to visit.      Allergies  Allergen Reactions  . Azithromycin     As child   . Bystolic [Nebivolol Hcl]     Past Medical History:  Diagnosis Date  . Anxiety   . Atrial fibrillation ([place])   . Hyperlipidemia   . Hypertension   . PVC (premature ventricular contraction)     Past Surgical History:  Procedure Laterality Date  . none      History  Smoking Status  . Never Smoker  Smokeless Tobacco  . Never Used    History  Alcohol Use  . 1.5 - 2.5 oz/week  . 3 - 5 drink(s) per week    Family History  Problem Relation Age of Onset  . Heart disease Mother   . Heart attack Mother     Reviw of Systems:  Reviewed in the HPI.  All other systems are negative.  Physical Exam: Blood pressure 100/80, pulse 60, height 6\' 2"  (1.88 m), weight 186 lb 12.8 oz (84.7 kg), SpO2 98 %. General: Well developed, well  nourished, in no acute distress.  Head: Normocephalic, atraumatic, sclera non-icteric, mucus membranes are moist  Neck: Supple. Negative  for carotid bruits. JVD not elevated.  Lungs: Clear bilaterally to auscultation without wheezes, rales, or rhonchi. Breathing is normal.  Heart: RR with S1 S2. No murmurs, rubs, or gallops   Abdomen: Soft, non-tender, non-distended with normoactive bowel sounds.   Msk:  Strength and tone appear normal for age.  Extremities: No clubbing or cyanosis. No edema.  Distal pedal pulses are 2+ and equal bilaterally.  Neuro: Alert and oriented X 3. Moves all extremities spontaneously.  Psych:  Responds to questions appropriately with a normal affect.    ECG:  Assessment / Plan:   1. Palpitations - has occasional premature atrial contractions and premature ventricular contractions. He'll continue to use Inderal on an as-needed basis.  i've recommended that he try a V-8 juice on occasion   2. Premature ventricular contractions - worsened when he forgot his metoprolol .    Better now. Will get an echo soon.   3. Paroxysmal atrial fibrillation - he's not had any recurrent episodes of atrial fibrillation. We discussed the fact that he would be a candidate for anticoagulation if he developed further episodes of a fibrillation. He's now 89 result and has a history of hypertension.  4. Dyslipidemia- continue aggressive treatment for his dyslipidemia. He has a low HDL.   5. Hypertension- his blood pressure is very well-controlled. Continue current medications.  Mertie Moores, MD  05/27/2016 9:04 AM    [facility] [employer],  [place], [place]  [address] Pager [phone] Phone: [phone]; Fax: [phone]

## 2016-05-31 ENCOUNTER — Other Ambulatory Visit: Payer: Self-pay | Admitting: Cardiovascular Disease

## 2016-05-31 DIAGNOSIS — E782 Mixed hyperlipidemia: Secondary | ICD-10-CM

## 2016-05-31 MED ORDER — NIACIN ER (ANTIHYPERLIPIDEMIC) 1000 MG PO TBCR: 1000.0000 mg | EXTENDED_RELEASE_TABLET | Freq: Every day | ORAL | 11 refills | Status: DC

## 2016-06-05 ENCOUNTER — Telehealth: Payer: Self-pay | Admitting: Cardiovascular Disease

## 2016-06-05 NOTE — Telephone Encounter (Signed)
PT CALLING REGARDING SON GOING TO ER IN ANOTHER STATE HE IS ASKING FOR A NURSE CALL TO ASK A FEW QUESTIONS TO DETERMINE HOW URGENT IT MAY BE AND IF HE NEEDS TO DRIVE THERE-PLS CALL

## 2016-06-05 NOTE — Telephone Encounter (Signed)
Pt called regarding his son which got sick in [place] while he was in the airplane. He states that his   after eating breakfast  got pale, with slurred speech . He  was giver OJ and felt better. Pt then was taken to ER. This pt is not a pt in this office. His father  Which is the caller  is Dr Elmarie Shiley pt. Pt (the father)was made aware that we can't give any recommendations for his son because his son is not a pt in this office. But if he is worried about his son and his son being so young (65 yo), probable it will be better for him to go to be at his side. But he needs to make that discission  Pt verbalized understanding.

## 2016-06-17 ENCOUNTER — Telehealth: Payer: Self-pay | Admitting: Cardiovascular Disease

## 2016-06-17 NOTE — Telephone Encounter (Signed)
.  note not needed

## 2016-07-31 ENCOUNTER — Telehealth: Payer: Self-pay | Admitting: Cardiovascular Disease

## 2016-07-31 NOTE — Telephone Encounter (Signed)
New Message  Pt voiced he is wanting nurse to call him back due to ruling something's out.  Pt voiced no CP or SOB.  Please f/u

## 2016-07-31 NOTE — Telephone Encounter (Signed)
Spoke with patient who states he has been having headaches for a couple of months with no relief; slight headaches around eyes, calls them tension headaches.  States he started meloxicam 10 days and has had slight relief but called PCP and was advised to come in for lab work since headaches continue.  He states he just received a call that his blood sodium level is elevated and to come back tomorrow to see the doctor. He called to ask if any of his symptoms are a concern in regards to his heart. He states he also feels cold all of the time to the point where he cannot get warm. He has been taking metoprolol for many years and states he rarely takes the additional propranolol. He does not have any pain or discoloration in his extremities and denies hx of smoking. I advised that likely his symptoms are not related to his heart but that I will forward to Dr. Acie Fredrickson for review. I advised that I will review his medications with our pharmacist and call him back if there are any medications that he should hold based on hypernatremia. He verbalized understanding and agreement with plan of care and thanked me for the call.

## 2016-08-01 NOTE — Telephone Encounter (Signed)
I reviewed the patient's medications with our pharmacist Fuller Canada on 07/31/16 and she advised that there was no likely cause of hypernatremia from patient's current medication list.

## 2016-08-01 NOTE — Telephone Encounter (Signed)
Agree that this is likely not cardiac related. He is going to see his primary MD

## 2016-08-02 ENCOUNTER — Ambulatory Visit
Admission: RE | Admit: 2016-08-02 | Discharge: 2016-08-02 | Disposition: A | Discharge status: Home / Self Care | Payer: BLUE CROSS/BLUE SHIELD | Source: Ambulatory Visit | Attending: Internal Medicine | Admitting: Internal Medicine

## 2016-08-02 ENCOUNTER — Other Ambulatory Visit: Payer: Self-pay | Admitting: Internal Medicine

## 2016-08-02 DIAGNOSIS — R51 Headache: Principal | ICD-10-CM

## 2016-08-02 DIAGNOSIS — G8929 Other chronic pain: Secondary | ICD-10-CM

## 2016-08-02 NOTE — Telephone Encounter (Signed)
Called patient to follow up on issue of hypernatremia.  He states he saw his PCP yesterday for follow-up after being advised to drink copious amounts of water. He states his level was actually slightly lower than normal upon retest.  He states Dr. Steva Ready states he believes it was a lab error. Patient reports headaches are better but c/o leg cramping at night.  He states he walked last night approximately 1.5 miles and had cramps during the night.  I asked if he knows what blood K+ was yesterday and he states he is not aware.  I advised him that K+ level has typically been low normal in our office and to try increasing dietary K+.  He verbalized agreement and asked if niaspan could cause cramps.  I advised him to try increasing dietary K+ first and if cramping continues, I can discuss patient stopping niaspan with Dr. Acie Fredrickson. He verbalized understanding and agreement with plan and thanked me for the call.

## 2016-08-15 ENCOUNTER — Other Ambulatory Visit: Payer: Self-pay | Admitting: Cardiovascular Disease

## 2016-08-15 DIAGNOSIS — E7800 Pure hypercholesterolemia, unspecified: Secondary | ICD-10-CM

## 2016-08-15 MED ORDER — RAMIPRIL 5 MG PO CAPS: 5.0000 mg | ORAL_CAPSULE | Freq: Two times a day (BID) | ORAL | 6 refills | Status: DC

## 2016-08-21 ENCOUNTER — Telehealth: Payer: Self-pay | Admitting: Cardiovascular Disease

## 2016-08-21 ENCOUNTER — Other Ambulatory Visit: Payer: Self-pay | Admitting: *Deleted

## 2016-08-21 DIAGNOSIS — E7800 Pure hypercholesterolemia, unspecified: Secondary | ICD-10-CM

## 2016-08-21 MED ORDER — RAMIPRIL 5 MG PO CAPS: 5.0000 mg | ORAL_CAPSULE | Freq: Two times a day (BID) | ORAL | 3 refills | Status: DC

## 2016-08-21 NOTE — Telephone Encounter (Signed)
°*  STAT* If patient is at the pharmacy, call can be transferred to refill team.   1. Which medications need to be refilled? (please list name of each medication and dose if known) Rampril 5mg  twice a day ( Needs a new Prescription Sent )   2. Which pharmacy/location (including street and city if local pharmacy) is medication to be sent to?[facility]   3. Do they need a 30 day or 90 day supply?37   Gregory Hill is requesting a call back to let him that it was sent . Thanks

## 2016-08-21 NOTE — Telephone Encounter (Signed)
I will send to requested pharmacy.  Medication Detail    Disp Refills Start End   ramipril (ALTACE) 5 MG capsule 180 capsule 6 08/15/2016    Sig - Route: Take 1 capsule (5 mg total) by mouth 2 (two) times daily. - Oral   E-Prescribing Status: Receipt confirmed by pharmacy (08/15/2016 8:26 PM EST)   Associated Diagnoses   Pure hypercholesterolemia     [place] [id number] - [place], [address] AT [address]

## 2016-08-28 ENCOUNTER — Telehealth: Payer: Self-pay | Admitting: Cardiovascular Disease

## 2016-08-28 NOTE — Telephone Encounter (Signed)
Returned call to patient.He stated since 05/2016 his B/P has been elevated ranging 145 to 150 / 80.Stated he is on low salt diet,doing all the right things and his B/P still too high.He wanted to ask Dr.Nahser if he needs to increase or change medications.Message sent to Dr.Nahser for advice.

## 2016-08-28 NOTE — Telephone Encounter (Signed)
Lets try a new BP med DC Altace Start Valsartan 160 mg a day  Check BMP in 3 weeks Have him continue to check BP daily

## 2016-08-28 NOTE — Telephone Encounter (Signed)
Gregory Hill is calling because his blood pressure is normally under control , but for the las couple of months it has been elevated . Wants to know what can he be doing and not sure why it is up . Please call

## 2016-08-29 MED ORDER — VALSARTAN 160 MG PO TABS: 160.0000 mg | ORAL_TABLET | Freq: Every day | ORAL | 11 refills | Status: DC

## 2016-08-29 NOTE — Telephone Encounter (Signed)
Spoke with patient and reviewed Dr. Elmarie Shiley advice with him. He verbalized understanding and agreement to stop altace and start valsartan 160 mg daily. He took altace this morning so I advised him not to take 2nd dose today and start valsartan tomorrow.  He is scheduled for lab work at Dr. Jaynie Bream office in approximately 3 weeks. I advised that he ask Dr. Jaynie Bream office fax a copy of the bmet to [place].  I advised him to call back with questions or concerns. He verbalized understanding and agreement and thanked me for the call.

## 2016-09-02 ENCOUNTER — Telehealth: Payer: Self-pay | Admitting: Cardiovascular Disease

## 2016-09-02 NOTE — Telephone Encounter (Signed)
I spoke with the pt and he has not checked his BP today.  I updated medication list and instructed him to monitor his BP and contact the office if his BP remains elevated after making lifestyle changes. Pt agreed with plan.

## 2016-09-02 NOTE — Telephone Encounter (Signed)
I spoke with the pt and he states that he took a dosage of Valsartan Friday morning and about 9 hours later he was looking down at his phone and became very dizzy and had to grab a chair because he felt like he was going to pass out.  The pt did not take Valsartan on Saturday but he did take a dosage Sunday and felt wiped out.  The pt would like to stop Valsartan, restart Ramipril 5mg  twice a day and work on lifestyle modification to further address his elevated BP.  The pt will resume ramipril this morning.  I advised him that I will make Dr Acie Fredrickson aware of this information and see is he has any additional recommendations.

## 2016-09-02 NOTE — Telephone Encounter (Signed)
Gregory Hill is calling in reference to the new medication( Not sure of the name)  that he was placed on last Friday . He is not doing well on it and want to go back to the old medication( Lamapril )  . Can he go back on that today?

## 2016-09-02 NOTE — Telephone Encounter (Signed)
It sounds like I prescribed a dose that was too strong What is his BP today . He may restart the ramipril 5 BID and see how his BP does.

## 2016-09-18 ENCOUNTER — Telehealth: Payer: Self-pay | Admitting: Cardiovascular Disease

## 2016-09-18 NOTE — Telephone Encounter (Signed)
New Message    Pt states Gregory Hill called him regarding Valstartin? Pt does not take this medication he is on  ramipril (ALTACE) 5 MG capsule Take 1 capsule (5 mg total) by mouth 2 (two) times daily.   He doesn't think he needs to have blood work done

## 2016-09-18 NOTE — Telephone Encounter (Signed)
Spoke with patient and advised that if he had lab work with PCP as previously stated, that we would like to have a copy. I advised that since he was only on the valsartan for 5 days, he would not need to come in for lab work if not already done. He verified that he did have lab work for Dr. Doneen Poisson and that he will get results tomorrow. I asked him to ask for copies to be forwarded to [place] for our awareness. He verbalized understanding and agreement and thanked me for the call.

## 2016-10-28 ENCOUNTER — Other Ambulatory Visit: Payer: Self-pay | Admitting: *Deleted

## 2016-10-28 DIAGNOSIS — E785 Hyperlipidemia, unspecified: Secondary | ICD-10-CM

## 2016-10-28 MED ORDER — METOPROLOL TARTRATE 25 MG PO TABS: 12.5000 mg | ORAL_TABLET | Freq: Two times a day (BID) | ORAL | 2 refills | Status: DC

## 2016-10-29 ENCOUNTER — Encounter: Payer: Self-pay | Admitting: Cardiovascular Disease

## 2016-11-13 ENCOUNTER — Encounter: Payer: Self-pay | Admitting: Cardiovascular Disease

## 2016-11-13 ENCOUNTER — Ambulatory Visit (INDEPENDENT_AMBULATORY_CARE_PROVIDER_SITE_OTHER): Payer: BLUE CROSS/BLUE SHIELD | Admitting: Cardiovascular Disease

## 2016-11-13 VITALS — BP 114/78 | HR 53 | Ht 74.0 in | Wt 189.8 lb

## 2016-11-13 DIAGNOSIS — I10 Essential (primary) hypertension: Secondary | ICD-10-CM | POA: Diagnosis not present

## 2016-11-13 DIAGNOSIS — E782 Mixed hyperlipidemia: Secondary | ICD-10-CM | POA: Diagnosis not present

## 2016-11-13 NOTE — Progress Notes (Signed)
Gregory Hill Date of Birth  09-Jun-1951 [place]  [address]    [place]      [place], [place]  [address]            Problem List: 1. Palpitations 2. Premature ventricular contractions 3. Paroxysmal atrial fibrillation 4. Dyslipidemia 5. Hypertension     Gregory Hill is a 66 yo with a hx of palpitations, HTN, hyperlipidemia, transient atrial fibrillation.  He has a high stress job. He's noted episodes of tachycardia recently - as fast as 120 bmp.  He still walks regularly - 2-3 miles several times a week.  He has a strong family history of sudden cardiac death.  He's worn an event monitor several years ago which revealed normal sinus rhythm with occasional premature ventricular contractions.  His palpitations have improved some over the past several months. They have improved with propranolol and now is on Metoprolol 12.5 BID.  October 06, 2012:  He is doing well.  Still having some palpitations.  He is still walking regularly.   He is staying hydrated.  He is not having any problems with his palpitations.   Nov. 20, 2014:  Gregory Hill presents with persistent back pain for the past several weeks.  He has been in [place] watching his son play basketball.  He has had back pain for 3-4 weeks, associated with nausea ( especially when he has increase back / flank ache).  Does not feel like a ripping or tearing sensation.  Not worsened with twisting or turning his torso.   He has walked regularly - no CP or worsening of his back pain.   No pleuretic Cp.  Has had some leg cramping in his legs recently.  The pain acutely worsens with lying down ( increased significantly when I had him lie supine)    Jan. 12, 2015:  Gregory Hill continues to have intermittent back and chest pain.  Seems to get worse if he stands up or changes position.    Chest CT in mid Dec. was normal.   Still exercising.  Regularly.  2-3 miles each time.    February 07, 2014:  02/16/2015: Gregory Hill is seen  today for follow-up visit. He's had a history of hypertension. He's had paroxysmal atrial fibrillation in the remote past.  Has a history of hyperlipidemia and has been on Crestor. He's been having some leg cramps recently.  Gregory Hill thinks that the calf cramps are more related to his back issues and not the statin . The cramps are much better than several months .  Has had lots of back issues. Was going to have surgery but now his back is better.   His potassium levels have been borderline low.  Eats a banana every day . Drinks a V-8 occasionally.  Nov. 21, 2016:  Gregory Hill is here with some left shoulder pain .  Pain radiates down left arm to wrist. Has been traveling quite a bit.    Did lot of lifting 10 days ago. Able to walk several miles without any problems .   November 22, 2015:  Gregory Hill is here to discuss more palpitations ? Due to allergies.  Has some skipped beats.   Goes away.   Then recurs several hours later.  Sometime occurs after eating  Or after lying down in bed at the end of the day  HR has been slower   Dec 22, 2015:  Gregory Hill is here for a work in  visit for more palpitations. Remembered today that he had forgotten to take his metoprolol that day .   Walks 3 miles a day without problems.   Has PVCs - more when he eats.  More when he is sitting quitely at night.  Takes propropralol on occasion Has also had a brief run of tachycardia .  Lasts only 2-3 seconds  Does not want to wear a monitor .   Oct. 30, 2017:  Feeling better.   Has some PVCs.  They seem to have calmed down. Has not been doing much exercise Had some tachycardia playing golf.    Did not feel faint .  Brought his labs from work   November 13, 2016:  Gregory Hill is seen today for follow-up of his hypertension, patient's, paroxysmal atrial fibrillation. He also has a history of hyperlipidemia that has been well controlled on Crestor.  Has occasional palpitations - especially at night Still walks regularly . Is  thinking about retiring within the next 12 months    Current Outpatient Prescriptions on File Prior to Visit  Medication Sig Dispense Refill  . ALPRAZolam (XANAX) 0.5 MG tablet Take 1 tablet (0.5 mg total) by mouth at bedtime as needed for anxiety. 0.5mg  as needed 15 tablet 0  . aspirin 81 MG tablet Take 81 mg by mouth daily.      . cholecalciferol (VITAMIN D) 1000 UNITS tablet Take 1,000 Units by mouth daily.     . fenofibrate (TRICOR) 145 MG tablet Take 1 tablet (145 mg total) by mouth daily. 90 tablet 2  . metoprolol tartrate (LOPRESSOR) 25 MG tablet Take 0.5 tablets (12.5 mg total) by mouth 2 (two) times daily. 90 tablet 2  . Multiple Vitamin (MULTIVITAMIN) tablet Take 1 tablet by mouth daily.      . niacin (NIASPAN) 1000 MG CR tablet Take 1 tablet (1,000 mg total) by mouth at bedtime. 30 tablet 11  . omega-3 acid ethyl esters (LOVAZA) 1 g capsule Take 3 capsules (3 g total) by mouth daily. 270 capsule 2  . propranolol (INDERAL) 10 MG tablet Take 1 tablet (10 mg total) by mouth 4 (four) times daily. One tablet up to 4 times a day as needed for palpitations 60 tablet 11  . ramipril (ALTACE) 5 MG capsule Take 1 capsule (5 mg total) by mouth 2 (two) times daily.    . rosuvastatin (CRESTOR) 10 MG tablet Take 1 tablet (10 mg total) by mouth daily. 90 tablet 3  . valACYclovir (VALTREX) 1000 MG tablet Take 1 g by mouth 2 (two) times daily as needed. AS NEEDED FOR BREAKOUTS  1   No current facility-administered medications on file prior to visit.      Allergies  Allergen Reactions  . Azithromycin     As child   . Bystolic [Nebivolol Hcl]     Past Medical History:  Diagnosis Date  . Anxiety   . Atrial fibrillation ([place])   . Hyperlipidemia   . Hypertension   . PVC (premature ventricular contraction)     Past Surgical History:  Procedure Laterality Date  . none      History  Smoking Status  . Never Smoker  Smokeless Tobacco  . Never Used    History  Alcohol Use  . 1.5 -  2.5 oz/week  . 3 - 5 drink(s) per week    Family History  Problem Relation Age of Onset  . Heart disease Mother   . Heart attack Mother     Reviw of  Systems:  Reviewed in the HPI.  All other systems are negative.  Physical Exam: Blood pressure 114/78, pulse (!) 53, height 6\' 2"  (1.88 m), weight 189 lb 12.8 oz (86.1 kg), SpO2 98 %. General: Well developed, well nourished, in no acute distress.  Head: Normocephalic, atraumatic, sclera non-icteric, mucus membranes are moist  Neck: Supple. Negative for carotid bruits. JVD not elevated.  Lungs: Clear bilaterally to auscultation without wheezes, rales, or rhonchi. Breathing is normal.  Heart: RR with S1 S2. No murmurs, rubs, or gallops   Abdomen: Soft, non-tender, non-distended with normoactive bowel sounds.   Msk:  Strength and tone appear normal for age.  Extremities: No clubbing or cyanosis. No edema.  Distal pedal pulses are 2+ and equal bilaterally.  Neuro: Alert and oriented X 3. Moves all extremities spontaneously.  Psych:  Responds to questions appropriately with a normal affect.    ECG:  11/13/2016: Sinus bradycardia at 53. He has no ST or T wave changes.  Assessment / Plan:   1. Palpitations - has occasional premature atrial contractions and premature ventricular contractions. He'll continue to use Inderal on an as-needed basis.  i've recommended that he try a V-8 juice on occasion   2. Premature ventricular contractions -  Still has occasional palpitations. Overall, very well tolerated.   3. Paroxysmal atrial fibrillation - he's not had any recurrent episodes of atrial fibrillation. We discussed the fact that he would be a candidate for anticoagulation if he developed further episodes of atrial  fibrillation.   4. Dyslipidemia- continue aggressive treatment for his dyslipidemia. He has a low HDL. Will check lipids at next visit .   He had recent labs at Dr. Danna Hefty office.   5. Hypertension- his blood pressure  is very well-controlled. Continue current medications.  Mertie Moores, MD  11/13/2016 9:55 AM    [facility],  [place], [place]  [address] Pager [phone] Phone: [phone]; Fax: [phone]

## 2016-11-13 NOTE — Patient Instructions (Signed)
Medication Instructions:  Your physician recommends that you continue on your current medications as directed. Please refer to the Current Medication list given to you today.   Labwork: None Ordered   Testing/Procedures: None Ordered   Follow-Up: Your physician wants you to follow-up in: 6 months with Dr. Nahser.  You will receive a reminder letter in the mail two months in advance. If you don't receive a letter, please call our office to schedule the follow-up appointment.   If you need a refill on your cardiac medications before your next appointment, please call your pharmacy.   Thank you for choosing [facility]! Chastelyn Athens, RN [phone]    

## 2016-11-15 ENCOUNTER — Telehealth: Payer: Self-pay | Admitting: Cardiovascular Disease

## 2016-11-15 MED ORDER — PROPRANOLOL HCL 10 MG PO TABS: 10.0000 mg | ORAL_TABLET | Freq: Four times a day (QID) | ORAL | 6 refills | Status: DC | PRN

## 2016-11-15 NOTE — Telephone Encounter (Signed)
Spoke with patient and discussed shelf life of propranolol. I offered to refill medication with fewer pills per bottle because the patient rarely uses it, but likes to keep it on hand. He verbalized agreement with plan and thanked me for the call.

## 2016-11-15 NOTE — Telephone Encounter (Signed)
New message      Pt c/o medication issue:  1. Name of Medication: propranolol  2. How are you currently taking this medication (dosage and times per day)? 10mg   3. Are you having a reaction (difficulty breathing--STAT)? no 4. What is your medication issue? Pt has a full bottle of this medication.  He takes it whenever he needs it.  It will expire in 1 month.  Pt want to know if he can continue keeping it for longer than the year he has had it or should he throw this full bottle away and get a new presc to have on hand----just in case he needs it.  Does the medication really "expire" in one year?

## 2016-12-13 ENCOUNTER — Telehealth: Payer: Self-pay | Admitting: Cardiovascular Disease

## 2016-12-13 NOTE — Telephone Encounter (Signed)
New message    Pt is calling stating he had a cortisone shot yesterday. Pt wants to make sure going into the weekend he doesn't need to do anything different.  Patient c/o Palpitations:  High priority if patient c/o lightheadedness and shortness of breath.  1. How long have you been having palpitations? Pt states his palpitations have increased since having shot yesterday.  2. Are you currently experiencing lightheadedness and shortness of breath? No   3. Have you checked your BP and heart rate? (document readings) pt says his HR is in between 80-90. He says he has not taken bp  4. Are you experiencing any other symptoms? No

## 2016-12-13 NOTE — Telephone Encounter (Signed)
Called, spoke with pt. Pt stated he is having occasional palpations, and episodes of slightly increased HR at 80. Pt stated he had a cortisone injection in his shoulder yesterday. Pt denied CP or SOB. Pt stated he always get palpitations after his cortisone injections. Pt stated his BP today was 130/60 and HR 65. Pt stated he took Propranolol for his palpitations, and it resolved issue. Pt taking all meds as prescribed. Advised to continue taking the Propranolol as directed for palpitations. If pt experiences symptoms, CP, SOB, dizziness, syncope, report to the emergency department. Informed if palpations persist or get worse, or if HR is > 100, please call our office.

## 2016-12-16 NOTE — Telephone Encounter (Signed)
Events noted.   This sound stable.  He has had these previously

## 2016-12-20 ENCOUNTER — Other Ambulatory Visit: Payer: Self-pay | Admitting: *Deleted

## 2016-12-20 MED ORDER — FENOFIBRATE 145 MG PO TABS: 145.0000 mg | ORAL_TABLET | Freq: Every day | ORAL | 3 refills | Status: DC

## 2016-12-20 MED ORDER — OMEGA-3-ACID ETHYL ESTERS 1 G PO CAPS: 3.0000 g | ORAL_CAPSULE | Freq: Every day | ORAL | 3 refills | Status: DC

## 2017-02-12 ENCOUNTER — Telehealth: Payer: Self-pay | Admitting: Cardiovascular Disease

## 2017-02-12 NOTE — Telephone Encounter (Signed)
Spoke with patient who states he is having right shoulder pain that interferes with his sleep. States usually the pain subsides after he gets up and moves around but pain is more persistent today. He denies chest discomfort or SOB with exertion. He states he had an injection in his shoulder 2 months ago and symptoms got better but pain has returned in the last few days. He states he has had more palpitations recently. He is concerned that his current symptoms are not limited to musculoskeletal issues. I offered an appointment for an EKG tomorrow morning for reassurance. I advised that if EKG shows any abnormalities we will work him into Dr. Elmarie Shiley schedule for tomorrow. He verbalized understanding and agreement with plan and thanked me for the call.

## 2017-02-12 NOTE — Telephone Encounter (Signed)
New Message   pt verbalized that he is calling for rn   At night his right arm is giving his issues he has no other symptoms   Its a ache feeling shoulder to front side of elbow and it wakes him up an dits not a sharp pain

## 2017-02-13 ENCOUNTER — Ambulatory Visit (INDEPENDENT_AMBULATORY_CARE_PROVIDER_SITE_OTHER): Payer: BLUE CROSS/BLUE SHIELD | Admitting: Nurse Practitioner

## 2017-02-13 VITALS — BP 112/68 | HR 61 | Ht 74.0 in | Wt 185.7 lb

## 2017-02-13 DIAGNOSIS — E782 Mixed hyperlipidemia: Secondary | ICD-10-CM

## 2017-02-13 DIAGNOSIS — R002 Palpitations: Secondary | ICD-10-CM | POA: Diagnosis not present

## 2017-02-13 DIAGNOSIS — M25511 Pain in right shoulder: Secondary | ICD-10-CM

## 2017-02-13 NOTE — Patient Instructions (Signed)
Medication Instructions:  Your physician recommends that you continue on your current medications as directed. Please refer to the Current Medication list given to you today.   Labwork: Your physician recommends that you return for lab work in: 2 weeks before your office visit with Dr. Acie Fredrickson.  You will need to FAST for this appointment - nothing to eat or drink after midnight the night before except water.   Testing/Procedures: None Ordered   Follow-Up: Your physician recommends that you schedule a follow-up appointment in: October 2018 with Dr. Acie Fredrickson   If you need a refill on your cardiac medications before your next appointment, please call your pharmacy.   Thank you for choosing [facility]! Christen Bame, RN [phone]

## 2017-02-13 NOTE — Addendum Note (Signed)
Addended by: Emmaline Life on: 02/13/2017 12:07 PM   Modules accepted: Orders

## 2017-02-13 NOTE — Progress Notes (Signed)
1.) Reason for visit: shoulder pain and concerns about worsening palpitations  2.) Name of MD requesting visit: Dr. Acie Fredrickson  3.) H&P: patient presents for EKG for evaluation of right shoulder pain associated with worsening palpitations  4.) ROS related to problem: patient presents in NAD and states Rt shoulder pain occurs when he lays down which is also associated with worsening palpitations; he states symptoms usually resolve approximately 1 hour after getting up but yesterday his symptoms persisted  5.) Assessment and plan per MD:  Increase dietary K+ to help reduce palpitatons and follow up in October with fasting lab work before

## 2017-04-24 ENCOUNTER — Other Ambulatory Visit: Payer: Self-pay | Admitting: Cardiovascular Disease

## 2017-04-24 DIAGNOSIS — E7800 Pure hypercholesterolemia, unspecified: Secondary | ICD-10-CM

## 2017-04-24 MED ORDER — ROSUVASTATIN CALCIUM 10 MG PO TABS: 10.0000 mg | ORAL_TABLET | Freq: Every day | ORAL | 1 refills | Status: DC

## 2017-05-02 ENCOUNTER — Other Ambulatory Visit: Payer: BLUE CROSS/BLUE SHIELD

## 2017-05-07 ENCOUNTER — Other Ambulatory Visit: Payer: BLUE CROSS/BLUE SHIELD | Admitting: *Deleted

## 2017-05-07 DIAGNOSIS — E782 Mixed hyperlipidemia: Secondary | ICD-10-CM

## 2017-05-08 LAB — APOLIPOPROTEIN B: APOLIPOPROTEIN B: 75 mg/dL (ref 52–135)

## 2017-05-08 LAB — NMR LIPOPROF + GRAPH
Cholesterol: 105 mg/dL (ref 100–199)
HDL Cholesterol by NMR: 26 mg/dL — ABNORMAL LOW (ref >39)
HDL Particle Number: 29.9 umol/L — ABNORMAL LOW (ref >=30.5)
LDL Particle Number: 1003 nmol/L — ABNORMAL HIGH (ref <1000)
LDL SIZE: 20 nm (ref >20.5)
LDL-C: 63 mg/dL (ref 0–99)
LP-IR SCORE: 76 — AB (ref <=45)
SMALL LDL PARTICLE NUMBER: 669 nmol/L — AB (ref <=527)
TRIGLYCERIDES BY NMR: 79 mg/dL (ref 0–149)

## 2017-05-08 LAB — COMPREHENSIVE METABOLIC PANEL
ALK PHOS: 28 IU/L — AB (ref 39–117)
ALT: 21 IU/L (ref 0–44)
AST: 25 IU/L (ref 0–40)
Albumin/Globulin Ratio: 2.1 (ref 1.2–2.2)
Albumin: 4 g/dL (ref 3.6–4.8)
BUN/Creatinine Ratio: 16 (ref 10–24)
BUN: 16 mg/dL (ref 8–27)
Bilirubin Total: 0.7 mg/dL (ref 0.0–1.2)
CALCIUM: 8.9 mg/dL (ref 8.6–10.2)
CO2: 24 mmol/L (ref 20–29)
CREATININE: 1.01 mg/dL (ref 0.76–1.27)
Chloride: 105 mmol/L (ref 96–106)
GFR calc Af Amer: 88 mL/min/{1.73_m2} (ref >59)
GFR calc non Af Amer: 76 mL/min/{1.73_m2} (ref >59)
GLOBULIN, TOTAL: 1.9 g/dL (ref 1.5–4.5)
Glucose: 102 mg/dL — ABNORMAL HIGH (ref 65–99)
Potassium: 4.4 mmol/L (ref 3.5–5.2)
Sodium: 139 mmol/L (ref 134–144)
Total Protein: 5.9 g/dL — ABNORMAL LOW (ref 6.0–8.5)

## 2017-05-12 ENCOUNTER — Encounter: Payer: Self-pay | Admitting: Cardiovascular Disease

## 2017-05-12 ENCOUNTER — Other Ambulatory Visit: Payer: Self-pay | Admitting: Nurse Practitioner

## 2017-05-12 DIAGNOSIS — E782 Mixed hyperlipidemia: Secondary | ICD-10-CM

## 2017-05-12 MED ORDER — ROSUVASTATIN CALCIUM 20 MG PO TABS: 20.0000 mg | ORAL_TABLET | Freq: Every day | ORAL | 3 refills | Status: DC

## 2017-05-15 ENCOUNTER — Other Ambulatory Visit: Payer: Self-pay | Admitting: Cardiovascular Disease

## 2017-05-15 MED ORDER — ROSUVASTATIN CALCIUM 20 MG PO TABS: 20.0000 mg | ORAL_TABLET | Freq: Every day | ORAL | 1 refills | Status: DC

## 2017-05-19 ENCOUNTER — Ambulatory Visit (INDEPENDENT_AMBULATORY_CARE_PROVIDER_SITE_OTHER): Payer: BLUE CROSS/BLUE SHIELD | Admitting: Cardiovascular Disease

## 2017-05-19 ENCOUNTER — Encounter: Payer: Self-pay | Admitting: Cardiovascular Disease

## 2017-05-19 ENCOUNTER — Encounter: Payer: Self-pay | Admitting: Nurse Practitioner

## 2017-05-19 VITALS — BP 124/62 | HR 59 | Ht 74.0 in | Wt 185.0 lb

## 2017-05-19 DIAGNOSIS — I10 Essential (primary) hypertension: Secondary | ICD-10-CM | POA: Diagnosis not present

## 2017-05-19 DIAGNOSIS — E782 Mixed hyperlipidemia: Secondary | ICD-10-CM

## 2017-05-19 NOTE — Patient Instructions (Signed)
Medication Instructions:  Your physician recommends that you continue on your current medications as directed. Please refer to the Current Medication list given to you today.   Labwork: Your physician recommends that you return for lab work in: 3 months  You will need to FAST for this appointment - nothing to eat or drink after midnight the night before except water.    Testing/Procedures: None Ordered   Follow-Up: Your physician wants you to follow-up in: 6 months with Dr. Acie Fredrickson.  You will receive a reminder letter in the mail two months in advance. If you don't receive a letter, please call our office to schedule the follow-up appointment.   If you need a refill on your cardiac medications before your next appointment, please call your pharmacy.   Thank you for choosing [facility]! Christen Bame, RN [phone]

## 2017-05-19 NOTE — Progress Notes (Signed)
Gregory Hill Date of Birth  1951/06/15 [place]  [address]    [place]      [place], [place]  [address]            Problem List: 1. Palpitations 2. Premature ventricular contractions 3. Paroxysmal atrial fibrillation 4. Dyslipidemia 5. Hypertension   Gregory Hill is a 66 yo with a hx of palpitations, HTN, hyperlipidemia, transient atrial fibrillation.  He has a high stress job. He's noted episodes of tachycardia recently - as fast as 120 bmp.  He still walks regularly - 2-3 miles several times a week.  He has a strong family history of sudden cardiac death.  He's worn an event monitor several years ago which revealed normal sinus rhythm with occasional premature ventricular contractions.  His palpitations have improved some over the past several months. They have improved with propranolol and now is on Metoprolol 12.5 BID.  October 06, 2012:  He is doing well.  Still having some palpitations.  He is still walking regularly.   He is staying hydrated.  He is not having any problems with his palpitations.   Nov. 20, 2014:  Gregory Hill presents with persistent back pain for the past several weeks.  He has been in [place] watching his son play basketball.  He has had back pain for 3-4 weeks, associated with nausea ( especially when he has increase back / flank ache).  Does not feel like a ripping or tearing sensation.  Not worsened with twisting or turning his torso.   He has walked regularly - no CP or worsening of his back pain.   No pleuretic Cp.  Has had some leg cramping in his legs recently.  The pain acutely worsens with lying down ( increased significantly when I had him lie supine)    Jan. 12, 2015:  Gregory Hill continues to have intermittent back and chest pain.  Seems to get worse if he stands up or changes position.    Chest CT in mid Dec. was normal.   Still exercising.  Regularly.  2-3 miles each time.    February 07, 2014:  02/16/2015: Gregory Hill is seen  today for follow-up visit. He's had a history of hypertension. He's had paroxysmal atrial fibrillation in the remote past.  Has a history of hyperlipidemia and has been on Crestor. He's been having some leg cramps recently.  Gregory Hill thinks that the calf cramps are more related to his back issues and not the statin . The cramps are much better than several months .  Has had lots of back issues. Was going to have surgery but now his back is better.   His potassium levels have been borderline low.  Eats a banana every day . Drinks a V-8 occasionally.  Nov. 21, 2016:  Gregory Hill is here with some left shoulder pain .  Pain radiates down left arm to wrist. Has been traveling quite a bit.    Did lot of lifting 10 days ago. Able to walk several miles without any problems .   November 22, 2015:  Gregory Hill is here to discuss more palpitations ? Due to allergies.  Has some skipped beats.   Goes away.   Then recurs several hours later.  Sometime occurs after eating  Or after lying down in bed at the end of the day  HR has been slower   Dec 22, 2015:  Gregory Hill is here for a work in visit  for more palpitations. Remembered today that he had forgotten to take his metoprolol that day .   Walks 3 miles a day without problems.   Has PVCs - more when he eats.  More when he is sitting quitely at night.  Takes propropralol on occasion Has also had a brief run of tachycardia .  Lasts only 2-3 seconds  Does not want to wear a monitor .   Oct. 30, 2017:  Feeling better.   Has some PVCs.  They seem to have calmed down. Has not been doing much exercise Had some tachycardia playing golf.    Did not feel faint .  Brought his labs from work   November 13, 2016:  Gregory Hill is seen today for follow-up of his hypertension, patient's, paroxysmal atrial fibrillation. He also has a history of hyperlipidemia that has been well controlled on Crestor.  Has occasional palpitations - especially at night Still walks regularly . Is  thinking about retiring within the next 12 months    Oct. 22, 2018:  Has set his retirement day - 07/28/17 Is doing well Has had some palpitations - have eased some  Still walking regularly , no episodes of AF  Lipomed profile - LDL particle number 1003.  Insulin IR score = 76 ( normal is < 45)   Current Outpatient Prescriptions on File Prior to Visit  Medication Sig Dispense Refill  . ALPRAZolam (XANAX) 0.5 MG tablet Take 1 tablet (0.5 mg total) by mouth at bedtime as needed for anxiety. 0.5mg  as needed 15 tablet 0  . aspirin 81 MG tablet Take 81 mg by mouth daily.      . cholecalciferol (VITAMIN D) 1000 UNITS tablet Take 1,000 Units by mouth daily.     . fenofibrate (TRICOR) 145 MG tablet Take 1 tablet (145 mg total) by mouth daily. 90 tablet 3  . metoprolol tartrate (LOPRESSOR) 25 MG tablet Take 0.5 tablets (12.5 mg total) by mouth 2 (two) times daily. 90 tablet 2  . Multiple Vitamin (MULTIVITAMIN) tablet Take 1 tablet by mouth daily.      . niacin (NIASPAN) 1000 MG CR tablet Take 1 tablet (1,000 mg total) by mouth at bedtime. 30 tablet 11  . omega-3 acid ethyl esters (LOVAZA) 1 g capsule Take 3 capsules (3 g total) by mouth daily. 270 capsule 3  . propranolol (INDERAL) 10 MG tablet Take 1 tablet (10 mg total) by mouth 4 (four) times daily as needed. 15 tablet 6  . ramipril (ALTACE) 5 MG capsule Take 1 capsule (5 mg total) by mouth 2 (two) times daily.    . rosuvastatin (CRESTOR) 20 MG tablet Take 1 tablet (20 mg total) by mouth daily. 90 tablet 1  . valACYclovir (VALTREX) 1000 MG tablet Take 1 g by mouth 2 (two) times daily as needed. AS NEEDED FOR BREAKOUTS  1   No current facility-administered medications on file prior to visit.      Allergies  Allergen Reactions  . Azithromycin     As child   . Bystolic [Nebivolol Hcl]     Past Medical History:  Diagnosis Date  . Anxiety   . Atrial fibrillation ([place])   . Hyperlipidemia   . Hypertension   . PVC (premature ventricular  contraction)     Past Surgical History:  Procedure Laterality Date  . none      History  Smoking Status  . Never Smoker  Smokeless Tobacco  . Never Used    History  Alcohol Use  .  1.5 - 2.5 oz/week  . 3 - 5 drink(s) per week    Family History  Problem Relation Age of Onset  . Heart disease Mother   . Heart attack Mother     Reviw of Systems:  Reviewed in the HPI.  All other systems are negative.  Physical Exam: Blood pressure 124/62, pulse (!) 59, height 6\' 2"  (1.88 m), weight 185 lb (83.9 kg), SpO2 99 %.  GEN:  Well nourished, well developed in no acute distress HEENT: Normal NECK: No JVD; No carotid bruits LYMPHATICS: No lymphadenopathy CARDIAC: RRR , no murmurs, rubs, gallops RESPIRATORY:  Clear to auscultation without rales, wheezing or rhonchi  ABDOMEN: Soft, non-tender, non-distended MUSCULOSKELETAL:  No edema; No deformity  SKIN: Warm and dry NEUROLOGIC:  Alert and oriented x 3   ECG:   .  Assessment / Plan:   1. Palpitations - better. Takes Propranolol   2. Premature ventricular contractions -  Takes propranolol   3. Paroxysmal atrial fibrillation -  No recent episodes  Continue to follow    4. Dyslipidemia-  His  Insurance company  did not pay for the Lipomed profile.  We have increased the Crestor to 20.   Will check lipids, liver and BMP in 3 months  May consider a repeat lipomed profile in a year   5. Hypertension-    BP is well controlled  Mertie Moores, MD  05/19/2017 8:07 AM    [facility] [place],  [place], [place]  [address] Pager [phone] Phone: [phone]; Fax: [phone]

## 2017-06-03 ENCOUNTER — Telehealth: Payer: Self-pay | Admitting: Cardiovascular Disease

## 2017-06-03 NOTE — Telephone Encounter (Signed)
Advised patient that per Dr. Acie Fredrickson, he may d/c the Niaspan. I advised that he is also in agreement with patient taking the Niaspan if he so chooses. Patient states we can reevaluate after the next lab work is resulted. He thanked me for the call.

## 2017-06-03 NOTE — Telephone Encounter (Signed)
New message   Patient wants to confirm he should no longer be taking, or continue taking Niacin.    Pt c/o medication issue:  1. Name of Medication: niacin (NIASPAN) 1000 MG CR tablet  2. How are you currently taking this medication (dosage and times per day)? Take 1 tablet (1,000 mg total) by mouth at bedtime.  3. Are you having a reaction (difficulty breathing--STAT)? NO  4. What is your medication issue? Patient wants to know if it was okay for him to stop taking medication. He stopped medication 10 days ago.

## 2017-07-08 ENCOUNTER — Other Ambulatory Visit: Payer: Self-pay | Admitting: Cardiovascular Disease

## 2017-07-08 DIAGNOSIS — E785 Hyperlipidemia, unspecified: Secondary | ICD-10-CM

## 2017-07-10 ENCOUNTER — Other Ambulatory Visit: Payer: Self-pay | Admitting: Cardiovascular Disease

## 2017-07-10 MED ORDER — RAMIPRIL 5 MG PO CAPS
5.0000 mg | ORAL_CAPSULE | Freq: Two times a day (BID) | ORAL | 3 refills | Status: DC
Start: 2017-07-10 — End: 2017-09-05

## 2017-07-17 ENCOUNTER — Other Ambulatory Visit: Payer: Self-pay | Admitting: Cardiovascular Disease

## 2017-07-17 MED ORDER — PROPRANOLOL HCL 10 MG PO TABS: 10.0000 mg | ORAL_TABLET | Freq: Four times a day (QID) | ORAL | 9 refills | Status: DC | PRN

## 2017-07-17 NOTE — Telephone Encounter (Signed)
Pt's medication was sent to pt's pharmacy as requested. Confirmation received.  °

## 2017-08-15 ENCOUNTER — Telehealth: Payer: Self-pay | Admitting: Cardiovascular Disease

## 2017-08-15 NOTE — Telephone Encounter (Signed)
Returned patient's call regarding his concern over extensive swimming.  He is learning how to swim and has to put forth extensive effort learning.  I told him to tolerate what he is able to withstand and if he feels symptomatic, reduce activity.

## 2017-08-15 NOTE — Telephone Encounter (Signed)
New message   Patient wants to know if it will be ok to do an intense swimming exercise. Please call

## 2017-08-17 NOTE — Telephone Encounter (Signed)
Gregory Hill has no restrictions on activity

## 2017-08-20 ENCOUNTER — Other Ambulatory Visit: Payer: Medicare Other | Admitting: *Deleted

## 2017-08-20 DIAGNOSIS — I10 Essential (primary) hypertension: Secondary | ICD-10-CM

## 2017-08-20 DIAGNOSIS — E782 Mixed hyperlipidemia: Secondary | ICD-10-CM | POA: Diagnosis not present

## 2017-08-20 LAB — BASIC METABOLIC PANEL
BUN/Creatinine Ratio: 19 (ref 10–24)
BUN: 18 mg/dL (ref 8–27)
CO2: 23 mmol/L (ref 20–29)
CREATININE: 0.96 mg/dL (ref 0.76–1.27)
Calcium: 9 mg/dL (ref 8.6–10.2)
Chloride: 102 mmol/L (ref 96–106)
GFR calc Af Amer: 94 mL/min/{1.73_m2} (ref >59)
GFR calc non Af Amer: 81 mL/min/{1.73_m2} (ref >59)
GLUCOSE: 98 mg/dL (ref 65–99)
Potassium: 4.2 mmol/L (ref 3.5–5.2)
SODIUM: 138 mmol/L (ref 134–144)

## 2017-08-20 LAB — LIPID PANEL
Chol/HDL Ratio: 4 ratio (ref 0.0–5.0)
Cholesterol, Total: 96 mg/dL — ABNORMAL LOW (ref 100–199)
HDL: 24 mg/dL — AB (ref >39)
LDL Calculated: 57 mg/dL (ref 0–99)
TRIGLYCERIDES: 73 mg/dL (ref 0–149)
VLDL Cholesterol Cal: 15 mg/dL (ref 5–40)

## 2017-08-20 LAB — HEPATIC FUNCTION PANEL
ALK PHOS: 34 IU/L — AB (ref 39–117)
ALT: 26 IU/L (ref 0–44)
AST: 32 IU/L (ref 0–40)
Albumin: 4.2 g/dL (ref 3.6–4.8)
BILIRUBIN, DIRECT: 0.18 mg/dL (ref 0.00–0.40)
Bilirubin Total: 0.5 mg/dL (ref 0.0–1.2)
TOTAL PROTEIN: 6.1 g/dL (ref 6.0–8.5)

## 2017-08-20 NOTE — Telephone Encounter (Signed)
Spoke with patient and advised him that he does not have any activity restrictions. He verbalized understanding and thanked me for the call.

## 2017-08-21 ENCOUNTER — Encounter: Payer: Self-pay | Admitting: Cardiovascular Disease

## 2017-08-21 ENCOUNTER — Other Ambulatory Visit: Payer: Self-pay | Admitting: Nurse Practitioner

## 2017-08-21 MED ORDER — NIACIN ER (ANTIHYPERLIPIDEMIC) 1000 MG PO TBCR: 1000.0000 mg | EXTENDED_RELEASE_TABLET | Freq: Every day | ORAL | 3 refills | Status: DC

## 2017-08-21 NOTE — Progress Notes (Signed)
Niaspan 1000 mg daily added back to patient's medication list after discussion between patient and Dr. Acie Fredrickson regarding patient's HDL level

## 2017-08-25 DIAGNOSIS — R509 Fever, unspecified: Secondary | ICD-10-CM | POA: Diagnosis not present

## 2017-08-25 DIAGNOSIS — Z6824 Body mass index (BMI) 24.0-24.9, adult: Secondary | ICD-10-CM | POA: Diagnosis not present

## 2017-08-25 DIAGNOSIS — R05 Cough: Secondary | ICD-10-CM | POA: Diagnosis not present

## 2017-08-25 DIAGNOSIS — J069 Acute upper respiratory infection, unspecified: Secondary | ICD-10-CM | POA: Diagnosis not present

## 2017-09-05 ENCOUNTER — Ambulatory Visit (INDEPENDENT_AMBULATORY_CARE_PROVIDER_SITE_OTHER): Payer: Medicare Other | Admitting: Cardiovascular Disease

## 2017-09-05 ENCOUNTER — Encounter: Payer: Self-pay | Admitting: Cardiovascular Disease

## 2017-09-05 VITALS — BP 126/66 | HR 68 | Wt 185.0 lb

## 2017-09-05 DIAGNOSIS — I10 Essential (primary) hypertension: Secondary | ICD-10-CM | POA: Diagnosis not present

## 2017-09-05 DIAGNOSIS — E785 Hyperlipidemia, unspecified: Secondary | ICD-10-CM

## 2017-09-05 MED ORDER — OMEGA-3-ACID ETHYL ESTERS 1 G PO CAPS: 3.0000 g | ORAL_CAPSULE | Freq: Every day | ORAL | 3 refills | Status: DC

## 2017-09-05 MED ORDER — NIACIN ER (ANTIHYPERLIPIDEMIC) 1000 MG PO TBCR: 1000.0000 mg | EXTENDED_RELEASE_TABLET | Freq: Every day | ORAL | 3 refills | Status: DC

## 2017-09-05 MED ORDER — FENOFIBRATE 145 MG PO TABS: 145.0000 mg | ORAL_TABLET | Freq: Every day | ORAL | 3 refills | Status: DC

## 2017-09-05 MED ORDER — RAMIPRIL 5 MG PO CAPS: 5.0000 mg | ORAL_CAPSULE | Freq: Two times a day (BID) | ORAL | 3 refills | Status: DC

## 2017-09-05 MED ORDER — METOPROLOL TARTRATE 25 MG PO TABS: 12.5000 mg | ORAL_TABLET | Freq: Two times a day (BID) | ORAL | 3 refills | Status: DC

## 2017-09-05 MED ORDER — ROSUVASTATIN CALCIUM 20 MG PO TABS: 20.0000 mg | ORAL_TABLET | Freq: Every day | ORAL | 3 refills | Status: DC

## 2017-09-05 NOTE — Patient Instructions (Signed)

## 2017-09-05 NOTE — Progress Notes (Signed)
[name] Date of Birth  16-Jul-1951 [place]  [address]    [place]      [place], [place]  [address]            Problem List: 1. Palpitations 2. Premature ventricular contractions 3. Paroxysmal atrial fibrillation 4. Dyslipidemia 5. Hypertension   Gregory Hill is a 68 yo with a hx of palpitations, HTN, hyperlipidemia, transient atrial fibrillation.  He has a high stress job. He's noted episodes of tachycardia recently - as fast as 120 bmp.  He still walks regularly - 2-3 miles several times a week.  He has a strong family history of sudden cardiac death.  He's worn an event monitor several years ago which revealed normal sinus rhythm with occasional premature ventricular contractions.  His palpitations have improved some over the past several months. They have improved with propranolol and now is on Metoprolol 12.5 BID.  October 06, 2012:  He is doing well.  Still having some palpitations.  He is still walking regularly.   He is staying hydrated.  He is not having any problems with his palpitations.   Nov. 20, 2014:  Gregory Hill presents with persistent back pain for the past several weeks.  He has been in [place] watching his son play basketball.  He has had back pain for 3-4 weeks, associated with nausea ( especially when he has increase back / flank ache).  Does not feel like a ripping or tearing sensation.  Not worsened with twisting or turning his torso.   He has walked regularly - no CP or worsening of his back pain.   No pleuretic Cp.  Has had some leg cramping in his legs recently.  The pain acutely worsens with lying down ( increased significantly when I had him lie supine)    Jan. 12, 2015:  Gregory Hill continues to have intermittent back and chest pain.  Seems to get worse if he stands up or changes position.    Chest CT in mid Dec. was normal.   Still exercising.  Regularly.  2-3 miles each time.    February 07, 2014:  02/16/2015: Gregory Hill is seen  today for follow-up visit. He's had a history of hypertension. He's had paroxysmal atrial fibrillation in the remote past.  Has a history of hyperlipidemia and has been on Crestor. He's been having some leg cramps recently.  Gregory Hill thinks that the calf cramps are more related to his back issues and not the statin . The cramps are much better than several months .  Has had lots of back issues. Was going to have surgery but now his back is better.   His potassium levels have been borderline low.  Eats a banana every day . Drinks a V-8 occasionally.  Nov. 21, 2016:  Gregory Hill is here with some left shoulder pain .  Pain radiates down left arm to wrist. Has been traveling quite a bit.    Did lot of lifting 10 days ago. Able to walk several miles without any problems .   November 22, 2015:  Gregory Hill is here to discuss more palpitations ? Due to allergies.  Has some skipped beats.   Goes away.   Then recurs several hours later.  Sometime occurs after eating  Or after lying down in bed at the end of the day  HR has been slower   Dec 22, 2015:  Gregory Hill is here for a work in visit  for more palpitations. Remembered today that he had forgotten to take his metoprolol that day .   Walks 3 miles a day without problems.   Has PVCs - more when he eats.  More when he is sitting quitely at night.  Takes propropralol on occasion Has also had a brief run of tachycardia .  Lasts only 2-3 seconds  Does not want to wear a monitor .   Oct. 30, 2017:  Feeling better.   Has some PVCs.  They seem to have calmed down. Has not been doing much exercise Had some tachycardia playing golf.    Did not feel faint .  Brought his labs from work   November 13, 2016:  Gregory Hill is seen today for follow-up of his hypertension, patient's, paroxysmal atrial fibrillation. He also has a history of hyperlipidemia that has been well controlled on Crestor.  Has occasional palpitations - especially at night Still walks regularly . Is  thinking about retiring within the next 12 months    Oct. 22, 2018:  Has set his retirement day - 07/28/17 Is doing well Has had some palpitations - have eased some  Still walking regularly , no episodes of AF  Lipomed profile - LDL particle number 1003.  Insulin IR score = 76 ( normal is < 45)   Feb. 8, 2019:  Gregory Hill is doing great. Has retired  Is learning to swim , work outs are tough.   No CP or dyspnea. Has PVCs  Is on rosuvastatin .   We tried stopping Niaspan and his HDL decreased dramatically so we have restarted.   Current Outpatient Medications on File Prior to Visit  Medication Sig Dispense Refill  . ALPRAZolam (XANAX) 0.5 MG tablet Take 1 tablet (0.5 mg total) by mouth at bedtime as needed for anxiety. 0.5mg  as needed 15 tablet 0  . aspirin 81 MG tablet Take 81 mg by mouth daily.      . cholecalciferol (VITAMIN D) 1000 UNITS tablet Take 1,000 Units by mouth daily.     . fenofibrate (TRICOR) 145 MG tablet Take 1 tablet (145 mg total) by mouth daily. 90 tablet 3  . metoprolol tartrate (LOPRESSOR) 25 MG tablet TAKE ONE-HALF TABLET BY MOUTH TWICE DAILY 90 tablet 0  . Multiple Vitamin (MULTIVITAMIN) tablet Take 1 tablet by mouth daily.      . niacin (NIASPAN) 1000 MG CR tablet Take 1 tablet (1,000 mg total) by mouth at bedtime. 90 tablet 3  . omega-3 acid ethyl esters (LOVAZA) 1 g capsule Take 3 capsules (3 g total) by mouth daily. 270 capsule 3  . propranolol (INDERAL) 10 MG tablet Take 1 tablet (10 mg total) by mouth 4 (four) times daily as needed. 15 tablet 9  . ramipril (ALTACE) 5 MG capsule Take 1 capsule (5 mg total) by mouth 2 (two) times daily. 180 capsule 3  . valACYclovir (VALTREX) 1000 MG tablet Take 1 g by mouth 2 (two) times daily as needed. AS NEEDED FOR BREAKOUTS  1  . rosuvastatin (CRESTOR) 20 MG tablet Take 1 tablet (20 mg total) by mouth daily. 90 tablet 1   No current facility-administered medications on file prior to visit.      Allergies  Allergen  Reactions  . Azithromycin     As child   . Bystolic [Nebivolol Hcl]     Past Medical History:  Diagnosis Date  . Anxiety   . Atrial fibrillation ([place])   . Hyperlipidemia   . Hypertension   .  PVC (premature ventricular contraction)     Past Surgical History:  Procedure Laterality Date  . none      Social History   Tobacco Use  Smoking Status Never Smoker  Smokeless Tobacco Never Used    Social History   Substance and Sexual Activity  Alcohol Use Yes  . Alcohol/week: 1.5 - 2.5 oz  . Types: 3 - 5 drink(s) per week    Family History  Problem Relation Age of Onset  . Heart disease Mother   . Heart attack Mother     Reviw of Systems:  .  Noted in current history.  Otherwise systems are negative.  Physical Exam: Blood pressure 126/66, pulse 68, weight 185 lb (83.9 kg), SpO2 98 %.  GEN:  Well nourished, well developed in no acute distress HEENT: Normal NECK: No JVD; No carotid bruits LYMPHATICS: No lymphadenopathy CARDIAC: RRR  RESPIRATORY:  Clear to auscultation without rales, wheezing or rhonchi  ABDOMEN: Soft, non-tender, non-distended MUSCULOSKELETAL:  No edema; No deformity  SKIN: Warm and dry NEUROLOGIC:  Alert and oriented x 3    ECG:   .  Assessment / Plan:   1. Palpitations -   Doing well.     2. Premature ventricular contractions -  Has PVCs,  Basically asymptomatic     3. Paroxysmal atrial fibrillation -   No recurrrence    4. Dyslipidemia-   Continue current meds.   5. Hypertension-    BP is well controlled , continue meds.    Mertie Moores, MD  09/05/2017 3:36 PM    [facility],  [place], [place]  [address] Pager [phone] Phone: [phone]; Fax: [phone]

## 2017-09-08 ENCOUNTER — Ambulatory Visit (INDEPENDENT_AMBULATORY_CARE_PROVIDER_SITE_OTHER): Payer: Medicare Other | Admitting: Cardiovascular Disease

## 2017-09-08 ENCOUNTER — Encounter: Payer: Self-pay | Admitting: Cardiovascular Disease

## 2017-09-08 ENCOUNTER — Telehealth: Payer: Self-pay | Admitting: Cardiovascular Disease

## 2017-09-08 VITALS — BP 114/72 | HR 58 | Ht 74.0 in | Wt 185.0 lb

## 2017-09-08 DIAGNOSIS — I480 Paroxysmal atrial fibrillation: Secondary | ICD-10-CM

## 2017-09-08 NOTE — Progress Notes (Signed)
Gregory Hill Date of Birth  1950/08/06 [place]  [address]    [place]      [place],   [address]            Problem List: 1. Palpitations 2. Premature ventricular contractions 3. Paroxysmal atrial fibrillation 4. Dyslipidemia 5. Hypertension   Gregory Hill is a 67 yo with a hx of palpitations, HTN, hyperlipidemia, transient atrial fibrillation.  He has a high stress job. He's noted episodes of tachycardia recently - as fast as 120 bmp.  He still walks regularly - 2-3 miles several times a week.  He has a strong family history of sudden cardiac death.  He's worn an event monitor several years ago which revealed normal sinus rhythm with occasional premature ventricular contractions.  His palpitations have improved some over the past several months. They have improved with propranolol and now is on Metoprolol 12.5 BID.  October 06, 2012:  He is doing well.  Still having some palpitations.  He is still walking regularly.   He is staying hydrated.  He is not having any problems with his palpitations.   Nov. 20, 2014:  Gregory Hill presents with persistent back pain for the past several weeks.  He has been in [place] watching his son play basketball.  He has had back pain for 3-4 weeks, associated with nausea ( especially when he has increase back / flank ache).  Does not feel like a ripping or tearing sensation.  Not worsened with twisting or turning his torso.   He has walked regularly - no CP or worsening of his back pain.   No pleuretic Cp.  Has had some leg cramping in his legs recently.  The pain acutely worsens with lying down ( increased significantly when I had him lie supine)    Jan. 12, 2015:  Gregory Hill continues to have intermittent back and chest pain.  Seems to get worse if he stands up or changes position.    Chest CT in mid Dec. was normal.   Still exercising.  Regularly.  2-3 miles each time.    February 07, 2014:  02/16/2015: Gregory Hill is seen  today for follow-up visit. He's had a history of hypertension. He's had paroxysmal atrial fibrillation in the remote past.  Has a history of hyperlipidemia and has been on Crestor. He's been having some leg cramps recently.  Gregory Hill thinks that the calf cramps are more related to his back issues and not the statin . The cramps are much better than several months .  Has had lots of back issues. Was going to have surgery but now his back is better.   His potassium levels have been borderline low.  Eats a banana every day . Drinks a V-8 occasionally.  Nov. 21, 2016:  Gregory Hill is here with some left shoulder pain .  Pain radiates down left arm to wrist. Has been traveling quite a bit.    Did lot of lifting 10 days ago. Able to walk several miles without any problems .   November 22, 2015:  Gregory Hill is here to discuss more palpitations ? Due to allergies.  Has some skipped beats.   Goes away.   Then recurs several hours later.  Sometime occurs after eating  Or after lying down in bed at the end of the day  HR has been slower   Dec 22, 2015:  Gregory Hill is here for a work in visit  for more palpitations. Remembered today that he had forgotten to take his metoprolol that day .   Walks 3 miles a day without problems.   Has PVCs - more when he eats.  More when he is sitting quitely at night.  Takes propropralol on occasion Has also had a brief run of tachycardia .  Lasts only 2-3 seconds  Does not want to wear a monitor .   Oct. 30, 2017:  Feeling better.   Has some PVCs.  They seem to have calmed down. Has not been doing much exercise Had some tachycardia playing golf.    Did not feel faint .  Brought his labs from work   November 13, 2016:  Gregory Hill is seen today for follow-up of his hypertension, patient's, paroxysmal atrial fibrillation. He also has a history of hyperlipidemia that has been well controlled on Crestor.  Has occasional palpitations - especially at night Still walks regularly . Is  thinking about retiring within the next 12 months    Oct. 22, 2018:  Has set his retirement day - 07/28/17 Is doing well Has had some palpitations - have eased some  Still walking regularly , no episodes of AF  Lipomed profile - LDL particle number 1003.  Insulin IR score = 76 ( normal is < 45)   Feb. 8, 2019:  Gregory Hill is doing great. Has retired  Is learning to swim , work outs are tough.   No CP or dyspnea. Has PVCs  Is on rosuvastatin .   We tried stopping Niaspan and his HDL decreased dramatically so we have restarted.   Feb. 11, 2019:  Gregory Hill is seen as a work in  Had an episode of PAF last night.  His symptoms were completely different than his PVCs.  He felt that his heart rate was very irregular. Lasted 30 min.  No real cp or dyspnea  Took a propranolol with resolution of palpitatoins in 30 min  Current Outpatient Medications on File Prior to Visit  Medication Sig Dispense Refill  . ALPRAZolam (XANAX) 0.5 MG tablet Take 1 tablet (0.5 mg total) by mouth at bedtime as needed for anxiety. 0.5mg  as needed 15 tablet 0  . aspirin 81 MG tablet Take 81 mg by mouth daily.      . cholecalciferol (VITAMIN D) 1000 UNITS tablet Take 1,000 Units by mouth daily.     . fenofibrate (TRICOR) 145 MG tablet Take 1 tablet (145 mg total) by mouth daily. 90 tablet 3  . metoprolol tartrate (LOPRESSOR) 25 MG tablet Take 0.5 tablets (12.5 mg total) by mouth 2 (two) times daily. 90 tablet 3  . Multiple Vitamin (MULTIVITAMIN) tablet Take 1 tablet by mouth daily.      . niacin (NIASPAN) 1000 MG CR tablet Take 1 tablet (1,000 mg total) by mouth at bedtime. 90 tablet 3  . omega-3 acid ethyl esters (LOVAZA) 1 g capsule Take 3 capsules (3 g total) by mouth daily. 270 capsule 3  . propranolol (INDERAL) 10 MG tablet Take 1 tablet (10 mg total) by mouth 4 (four) times daily as needed. 15 tablet 9  . ramipril (ALTACE) 5 MG capsule Take 1 capsule (5 mg total) by mouth 2 (two) times daily. 180 capsule 3  .  rosuvastatin (CRESTOR) 20 MG tablet Take 1 tablet (20 mg total) by mouth daily. 90 tablet 3  . valACYclovir (VALTREX) 1000 MG tablet Take 1 g by mouth 2 (two) times daily as needed. AS NEEDED FOR BREAKOUTS  1  No current facility-administered medications on file prior to visit.      Allergies  Allergen Reactions  . Azithromycin     As child   . Bystolic [Nebivolol Hcl]     Past Medical History:  Diagnosis Date  . Anxiety   . Atrial fibrillation ([place])   . Hyperlipidemia   . Hypertension   . PVC (premature ventricular contraction)     Past Surgical History:  Procedure Laterality Date  . none      Social History   Tobacco Use  Smoking Status Never Smoker  Smokeless Tobacco Never Used    Social History   Substance and Sexual Activity  Alcohol Use Yes  . Alcohol/week: 1.5 - 2.5 oz  . Types: 3 - 5 drink(s) per week    Family History  Problem Relation Age of Onset  . Heart disease Mother   . Heart attack Mother     Reviw of Systems:  .  Noted in current history.  Otherwise systems are negative.  Physical Exam: Blood pressure 114/72, pulse (!) 58, height 6\' 2"  (1.88 m), weight 185 lb (83.9 kg).  GEN:  Well nourished, well developed in no acute distress HEENT: Normal NECK: No JVD; No carotid bruits LYMPHATICS: No lymphadenopathy CARDIAC: RR, normal S1S2  RESPIRATORY:  Clear to auscultation without rales, wheezing or rhonchi  ABDOMEN: Soft, non-tender, non-distended MUSCULOSKELETAL:  No edema; No deformity  SKIN: Warm and dry NEUROLOGIC:  Alert and oriented x 3     ECG:    September 08, 2017: Sinus bradycardia at 58.  Right axis deviation.  Assessment / Plan:      1. Paroxysmal atrial fibrillation -     Gregory Hill had an episode of what he thought might of been atrial fibrillation.   HCADS2VASC is 2 ( age, HTN)   He is in normal sinus rhythm today.  I showed him the [place] monitor by [employer]. Will have him get a Kardia monitor If he has lots of PAF ,  will place an official 30 day monitor    2.  HTN   - well controlled   3. Dyslipidemia   Mertie Moores, MD  09/08/2017 10:24 AM    [address],  [place], [place]  [address] Pager [phone] Phone: [phone]; Fax: [phone]

## 2017-09-08 NOTE — Patient Instructions (Signed)

## 2017-09-08 NOTE — Telephone Encounter (Signed)
Spoke with patient who states he has been feeling well until Sunday night when he felt an irregular heart rhythm. He states he had taken a Claritin that evening and then laid down to read a book when he felt the irregularity. He states he took propranolol x 1 and felt heart return to regular rhythm but noticed that his HR was faster than normal. He states he continues to feel that pulse is faster than normal at approximately 90 bpm. He has a remote hx of a fib. I discussed options of wearing a 30 day monitor or purchasing a Kardia monitor for home use. He states he is supposed to go out of town today and would like to come by the office for an ekg. I advised that Dr. Acie Fredrickson has openings at 10:00 and 11:40. He is currently at the dentist office and will come in after that appointment. He thanked me for my help.

## 2017-09-08 NOTE — Telephone Encounter (Signed)
New message   Patient c/o Palpitations:  High priority if patient c/o lightheadedness, shortness of breath, or chest pain  1) How long have you had palpitations/irregular HR/ Afib? Are you having the symptoms now? Since last night and no  2) Are you currently experiencing lightheadedness, SOB or CP? no  3) Do you have a history of afib (atrial fibrillation) or irregular heart rhythm?  4) Have you checked your BP or HR? (document readings if available): yes, he said everything is fine  5) Are you experiencing any other symptoms? Pt says that his heart went our of rhythm for about 30 seconds last night

## 2017-09-11 ENCOUNTER — Encounter: Payer: Self-pay | Admitting: Cardiovascular Disease

## 2017-09-29 DIAGNOSIS — E7849 Other hyperlipidemia: Secondary | ICD-10-CM | POA: Diagnosis not present

## 2017-09-29 DIAGNOSIS — I10 Essential (primary) hypertension: Secondary | ICD-10-CM | POA: Diagnosis not present

## 2017-09-29 DIAGNOSIS — Z125 Encounter for screening for malignant neoplasm of prostate: Secondary | ICD-10-CM | POA: Diagnosis not present

## 2017-09-29 DIAGNOSIS — R82998 Other abnormal findings in urine: Secondary | ICD-10-CM | POA: Diagnosis not present

## 2017-10-03 DIAGNOSIS — Z1212 Encounter for screening for malignant neoplasm of rectum: Secondary | ICD-10-CM | POA: Diagnosis not present

## 2017-10-08 DIAGNOSIS — F418 Other specified anxiety disorders: Secondary | ICD-10-CM | POA: Diagnosis not present

## 2017-10-08 DIAGNOSIS — Z0000 Encounter for general adult medical examination without abnormal findings: Secondary | ICD-10-CM | POA: Diagnosis not present

## 2017-10-08 DIAGNOSIS — K5730 Diverticulosis of large intestine without perforation or abscess without bleeding: Secondary | ICD-10-CM | POA: Diagnosis not present

## 2017-10-08 DIAGNOSIS — E7849 Other hyperlipidemia: Secondary | ICD-10-CM | POA: Diagnosis not present

## 2017-10-08 DIAGNOSIS — Z6823 Body mass index (BMI) 23.0-23.9, adult: Secondary | ICD-10-CM | POA: Diagnosis not present

## 2017-10-08 DIAGNOSIS — Z8679 Personal history of other diseases of the circulatory system: Secondary | ICD-10-CM | POA: Diagnosis not present

## 2017-10-08 DIAGNOSIS — J302 Other seasonal allergic rhinitis: Secondary | ICD-10-CM | POA: Diagnosis not present

## 2017-10-08 DIAGNOSIS — Z8249 Family history of ischemic heart disease and other diseases of the circulatory system: Secondary | ICD-10-CM | POA: Diagnosis not present

## 2017-10-08 DIAGNOSIS — I10 Essential (primary) hypertension: Secondary | ICD-10-CM | POA: Diagnosis not present

## 2017-10-08 DIAGNOSIS — Z1389 Encounter for screening for other disorder: Secondary | ICD-10-CM | POA: Diagnosis not present

## 2017-10-27 ENCOUNTER — Telehealth: Payer: Self-pay | Admitting: Cardiovascular Disease

## 2017-10-27 DIAGNOSIS — E782 Mixed hyperlipidemia: Secondary | ICD-10-CM

## 2017-10-27 NOTE — Telephone Encounter (Signed)
New Message   Pt states he is suppose to have labs done in march due to a change in medication. And wants to know if he should still come in Please call

## 2017-10-29 NOTE — Telephone Encounter (Signed)
Spoke with patient about timing of repeat fasting lab work to evaluate cholesterol and current therapy. Patient has resumed Niaspan since as advised by Dr. Acie Fredrickson in January. He states he has also been exercising more regularly and vigorously since January. He states we discussed getting repeat NMR in April. He had cholesterol and apolipoprotein B screening with Dr. Dagmar Hait in March. I advised that I think June may be a better time frame for patient's lab work and he verbalized agreement. Lab appointment scheduled and patient aware to call back with additional questions or concerns.

## 2017-11-11 ENCOUNTER — Telehealth: Payer: Self-pay | Admitting: Cardiovascular Disease

## 2017-11-11 NOTE — Telephone Encounter (Signed)
Agree with Army Melia, RN . HR is only slightly slower . Continue to watch

## 2017-11-11 NOTE — Telephone Encounter (Signed)
New message    Patient calling C/o pulse rate is 53 took again 51 ask the nurse to call him back to discuss.    No Chest pain   No Sob

## 2017-11-11 NOTE — Telephone Encounter (Signed)
Spoke with patient who states he just returned from a trip, noticed he was a little tired but nothing severe. States he felt his pulse on 2 different occasions and it was 51 bpm and 53 bpm. His normal heart rate is approximately 58 bpm. He states he has not had any concerns prior to this finding. He takes metoprolol 12.5 mg twice daily. He states he has not taken any propranolol recently. I advised him to continue to monitor and to call back if he notices decreased rate or increased fatigue or other concerns. He asks if he can hold metoprolol on the occasion that HR is 50 bpm or lower and I confirmed. He verbalized understanding and agreement with plan and thanked me for the call.

## 2017-12-18 ENCOUNTER — Telehealth: Payer: Self-pay | Admitting: Cardiovascular Disease

## 2017-12-18 DIAGNOSIS — B351 Tinea unguium: Secondary | ICD-10-CM | POA: Diagnosis not present

## 2017-12-18 DIAGNOSIS — M545 Low back pain: Secondary | ICD-10-CM | POA: Diagnosis not present

## 2017-12-18 DIAGNOSIS — Z6823 Body mass index (BMI) 23.0-23.9, adult: Secondary | ICD-10-CM | POA: Diagnosis not present

## 2017-12-18 NOTE — Telephone Encounter (Signed)
NewMessage   Pt c/o medication issue:  1. Name of Medication: aspirin 81 MG tablet  2. How are you currently taking this medication (dosage and times per day)? Take 81 mg by mouth daily.  3. Are you having a reaction (difficulty breathing--STAT)? no  4. What is your medication issue? Pt states he has been hearing bad things about taking baby aspirin and wants to know if Nahser wants him to continue taking it. Please call

## 2017-12-18 NOTE — Telephone Encounter (Signed)
Spoke to patient and told him that it is highly recommended that he continues taking Aspirin 81 mg daily.

## 2017-12-18 NOTE — Telephone Encounter (Signed)
lpmtcb 5/23

## 2017-12-29 ENCOUNTER — Other Ambulatory Visit: Payer: Medicare Other | Admitting: *Deleted

## 2017-12-29 DIAGNOSIS — E782 Mixed hyperlipidemia: Secondary | ICD-10-CM | POA: Diagnosis not present

## 2017-12-30 ENCOUNTER — Other Ambulatory Visit: Payer: Medicare Other

## 2017-12-30 ENCOUNTER — Encounter: Payer: Self-pay | Admitting: Cardiovascular Disease

## 2017-12-30 LAB — NMR LIPOPROF + GRAPH
CHOLESTEROL, TOTAL: 106 mg/dL (ref 100–199)
HDL PARTICLE NUMBER: 29.7 umol/L — AB (ref >=30.5)
HDL-C: 31 mg/dL — ABNORMAL LOW (ref >39)
LDL PARTICLE NUMBER: 1026 nmol/L — AB (ref <1000)
LDL SIZE: 19.7 nm — AB (ref >20.5)
LDL-C: 62 mg/dL (ref 0–99)
LP-IR Score: 37 (ref <=45)
SMALL LDL PARTICLE NUMBER: 816 nmol/L — AB (ref <=527)
TRIGLYCERIDES: 65 mg/dL (ref 0–149)

## 2017-12-30 LAB — BASIC METABOLIC PANEL
BUN / CREAT RATIO: 17 (ref 10–24)
BUN: 17 mg/dL (ref 8–27)
CALCIUM: 9.4 mg/dL (ref 8.6–10.2)
CO2: 22 mmol/L (ref 20–29)
CREATININE: 1.02 mg/dL (ref 0.76–1.27)
Chloride: 105 mmol/L (ref 96–106)
GFR, EST AFRICAN AMERICAN: 87 mL/min/{1.73_m2} (ref >59)
GFR, EST NON AFRICAN AMERICAN: 75 mL/min/{1.73_m2} (ref >59)
Glucose: 102 mg/dL — ABNORMAL HIGH (ref 65–99)
Potassium: 4.1 mmol/L (ref 3.5–5.2)
Sodium: 139 mmol/L (ref 134–144)

## 2017-12-30 LAB — HEPATIC FUNCTION PANEL
ALK PHOS: 36 IU/L — AB (ref 39–117)
ALT: 23 IU/L (ref 0–44)
AST: 32 IU/L (ref 0–40)
Albumin: 4.3 g/dL (ref 3.6–4.8)
BILIRUBIN TOTAL: 0.7 mg/dL (ref 0.0–1.2)
BILIRUBIN, DIRECT: 0.22 mg/dL (ref 0.00–0.40)
Total Protein: 6.1 g/dL (ref 6.0–8.5)

## 2017-12-30 LAB — LIPOPROTEIN A (LPA): Lipoprotein (a): 24 nmol/L (ref <75)

## 2017-12-30 LAB — APOLIPOPROTEIN B: Apolipoprotein B: 65 mg/dL (ref <90)

## 2017-12-31 ENCOUNTER — Telehealth: Payer: Self-pay | Admitting: Cardiovascular Disease

## 2017-12-31 NOTE — Telephone Encounter (Signed)
New message    Patient has questions on his lab results please call

## 2017-12-31 NOTE — Telephone Encounter (Signed)
Spoke with patient and reviewed lab results. I advised that Dr. Acie Fredrickson has reviewed the results and thinks patient is doing well on current therapy of Rosuvastatin 20 mg. I advised that he would be agreeable to having patient increase Rosuvastatin to 40 mg daily if patient prefers. Patient states he increased to 20 mg earlier this year and will continue with current therapy until next blood test. I scheduled patient's 6 mo follow-up with Dr. Acie Fredrickson advised him to call back with additional questions or concerns prior to ov. He thanked me for the call.

## 2018-01-16 DIAGNOSIS — M5030 Other cervical disc degeneration, unspecified cervical region: Secondary | ICD-10-CM | POA: Diagnosis not present

## 2018-01-16 DIAGNOSIS — M542 Cervicalgia: Secondary | ICD-10-CM | POA: Diagnosis not present

## 2018-01-16 DIAGNOSIS — M25512 Pain in left shoulder: Secondary | ICD-10-CM | POA: Diagnosis not present

## 2018-01-16 DIAGNOSIS — M47812 Spondylosis without myelopathy or radiculopathy, cervical region: Secondary | ICD-10-CM | POA: Diagnosis not present

## 2018-01-18 ENCOUNTER — Encounter: Payer: Self-pay | Admitting: Cardiovascular Disease

## 2018-01-19 ENCOUNTER — Encounter: Payer: Self-pay | Admitting: Cardiovascular Disease

## 2018-01-19 ENCOUNTER — Telehealth: Payer: Self-pay | Admitting: *Deleted

## 2018-01-19 ENCOUNTER — Other Ambulatory Visit: Payer: Self-pay | Admitting: Nurse Practitioner

## 2018-01-19 MED ORDER — ROSUVASTATIN CALCIUM 10 MG PO TABS: 10.0000 mg | ORAL_TABLET | Freq: Every day | ORAL | 3 refills | Status: DC

## 2018-01-19 NOTE — Telephone Encounter (Signed)
Patient called and would like to know if he should stop taking the rosuvastatin 20 mg until he receives the shipment of the 10 mg tablets. I asked him if the tablets could be split and he just take one-half, but he stated that he does not think that they are scored. He would like to know if it would be okay to cut them in half as he just refilled the 20 mg tabs. Patient can be reached at [phone]. Thanks, MI

## 2018-01-19 NOTE — Telephone Encounter (Signed)
Patient called back, he states that he looked it up online and it stated that he could cut the 20MG  in half. So he will cut the 20MG  in half until he gets his RX of Crestor 10MG . He does not need a call back.

## 2018-01-20 ENCOUNTER — Telehealth: Payer: Self-pay | Admitting: Nurse Practitioner

## 2018-01-20 DIAGNOSIS — M79602 Pain in left arm: Secondary | ICD-10-CM

## 2018-01-20 DIAGNOSIS — I208 Other forms of angina pectoris: Secondary | ICD-10-CM

## 2018-01-20 NOTE — Telephone Encounter (Signed)
Received MyChart email from patient with symptoms of left arm pain (see previous email encounters). Dr. Acie Fredrickson advised that due to patient's history and symptoms, that we order a Coronary CT for left arm pain, anginal equivalent. Patient verbalized agreement with this plan and is aware that we will work on getting this scheduled to take place in the next 3-4 weeks. He agrees to call back with worsening symptoms. Patient had a BMET on 6/3, so depending on timing of the test will not order additional lab work if done within 6 weeks.

## 2018-01-21 DIAGNOSIS — M542 Cervicalgia: Secondary | ICD-10-CM | POA: Diagnosis not present

## 2018-01-22 ENCOUNTER — Telehealth: Payer: Self-pay

## 2018-01-22 NOTE — Telephone Encounter (Signed)
I think I can work him in for a visit tomorrow.   I will come over from the hospital and see him if we can get a CMA and nurse to help with the office visit.   Will call the office  Tomorrow and arrange

## 2018-01-22 NOTE — Telephone Encounter (Signed)
Spoke with patient who is having left arm pain and was trying to get in for an appointment either 6/27, 6/28 or 7/1 (8:00).  He leaves for vacation 7/1 @ 9:00am.  I searched all available physicians, but could not find anything.  I informed him that if the symptoms worsen, he should head to the ED.  He verbalized understanding.

## 2018-01-23 DIAGNOSIS — M542 Cervicalgia: Secondary | ICD-10-CM | POA: Diagnosis not present

## 2018-01-23 DIAGNOSIS — R0789 Other chest pain: Secondary | ICD-10-CM | POA: Diagnosis not present

## 2018-01-23 DIAGNOSIS — R634 Abnormal weight loss: Secondary | ICD-10-CM | POA: Diagnosis not present

## 2018-01-23 DIAGNOSIS — K219 Gastro-esophageal reflux disease without esophagitis: Secondary | ICD-10-CM | POA: Diagnosis not present

## 2018-01-23 DIAGNOSIS — Z6823 Body mass index (BMI) 23.0-23.9, adult: Secondary | ICD-10-CM | POA: Diagnosis not present

## 2018-01-23 NOTE — Telephone Encounter (Signed)
Dr Acie Fredrickson has called the patient and left him a message.  He is awaiting a return call.

## 2018-01-23 NOTE — Telephone Encounter (Signed)
I talked to Gregory Hill today.  Gregory Hill is been having neck pain with radiation down his arm for several weeks.  These radiating pain does not occur with exertion.  Gregory Hill has seen his orthopedist and also has seen his primary medical doctor.  They think that it is arthritis in his neck.  Gregory Hill had an EKG at his medical doctor's office today and there was no evidence of any acute coronary syndrome.  Gregory Hill will be leaving for [facility] on Monday.  I offered to work him in on Monday morning but Gregory Hill is planning on leaving at 81.  Symptoms do not sound cardiac.  Because Gregory Hill does have such a strong family history of cardiac disease, we will plan on getting a coronary CT angiogram in the next several weeks.  Gregory Hill will give me a call if Gregory Hill has any problems while Gregory Hill is on vacation.    Mertie Moores, MD  01/23/2018 12:21 PM    [address] [place],  [place], [place]  [address] Pager [phone] Phone: [phone]; Fax: [phone]

## 2018-02-02 DIAGNOSIS — L570 Actinic keratosis: Secondary | ICD-10-CM | POA: Diagnosis not present

## 2018-02-02 DIAGNOSIS — M542 Cervicalgia: Secondary | ICD-10-CM | POA: Diagnosis not present

## 2018-02-02 DIAGNOSIS — L738 Other specified follicular disorders: Secondary | ICD-10-CM | POA: Diagnosis not present

## 2018-02-02 DIAGNOSIS — L814 Other melanin hyperpigmentation: Secondary | ICD-10-CM | POA: Diagnosis not present

## 2018-02-02 DIAGNOSIS — D1801 Hemangioma of skin and subcutaneous tissue: Secondary | ICD-10-CM | POA: Diagnosis not present

## 2018-02-02 DIAGNOSIS — L821 Other seborrheic keratosis: Secondary | ICD-10-CM | POA: Diagnosis not present

## 2018-02-02 DIAGNOSIS — D485 Neoplasm of uncertain behavior of skin: Secondary | ICD-10-CM | POA: Diagnosis not present

## 2018-02-09 DIAGNOSIS — M542 Cervicalgia: Secondary | ICD-10-CM | POA: Diagnosis not present

## 2018-02-10 DIAGNOSIS — M546 Pain in thoracic spine: Secondary | ICD-10-CM | POA: Diagnosis not present

## 2018-02-11 DIAGNOSIS — K219 Gastro-esophageal reflux disease without esophagitis: Secondary | ICD-10-CM | POA: Diagnosis not present

## 2018-02-11 DIAGNOSIS — R634 Abnormal weight loss: Secondary | ICD-10-CM | POA: Diagnosis not present

## 2018-02-11 DIAGNOSIS — R0789 Other chest pain: Secondary | ICD-10-CM | POA: Diagnosis not present

## 2018-02-11 DIAGNOSIS — Z6823 Body mass index (BMI) 23.0-23.9, adult: Secondary | ICD-10-CM | POA: Diagnosis not present

## 2018-02-11 DIAGNOSIS — M542 Cervicalgia: Secondary | ICD-10-CM | POA: Diagnosis not present

## 2018-02-13 NOTE — Telephone Encounter (Signed)
Reviewed CT instructions with patient who verbalized understanding. He states Dr. Dagmar Hait did lab work this week and he will call to get it sent over. I advised that if it has not been received by July 30 we will schedule bmet at our office. He agrees to call back with questions and thanked me for the call.    Please arrive at the [place] main entrance of [facility] at 9 AM (30-45 minutes prior to test start time)  [facility] [place], [place] [address] [phone]  Proceed to the [facility] Radiology Department (First Floor).  Please follow these instructions carefully (unless otherwise directed):  Hold all erectile dysfunction medications at least 48 hours prior to test.  On the Night Before the Test: . Drink plenty of water. . Do not consume any caffeinated/decaffeinated beverages or chocolate 12 hours prior to your test. . Do not take any antihistamines 12 hours prior to your test.  On the Day of the Test: . Drink plenty of water. Do not drink any water within one hour of the test. . Do not eat any food 4 hours prior to the test. . You may take your regular medications prior to the test. . IF NOT ON A BETA BLOCKER - Take 50 mg of lopressor (metoprolol) one hour before the test. . HOLD Furosemide morning of the test.  After the Test: . Drink plenty of water. . After receiving IV contrast, you may experience a mild flushed feeling. This is normal. . On occasion, you may experience a mild rash up to 24 hours after the test. This is not dangerous. If this occurs, you can take Benadryl 25 mg and increase your fluid intake. . If you experience trouble breathing, this can be serious. If it is severe call 911 IMMEDIATELY. If it is mild, please call our office. . If you take any of these medications: Glipizide/Metformin, Avandament, Glucavance, please do not take 48 hours after completing test.

## 2018-02-22 ENCOUNTER — Encounter: Payer: Self-pay | Admitting: Cardiovascular Disease

## 2018-02-24 ENCOUNTER — Encounter (INDEPENDENT_AMBULATORY_CARE_PROVIDER_SITE_OTHER): Payer: Self-pay

## 2018-02-26 ENCOUNTER — Encounter (INDEPENDENT_AMBULATORY_CARE_PROVIDER_SITE_OTHER): Payer: Self-pay

## 2018-02-26 NOTE — Telephone Encounter (Signed)
Received lab work from Dr. Danna Hefty office and patient's kidney function is stable. Patient does not need any additional instruction prior to coronary CT.

## 2018-02-27 ENCOUNTER — Ambulatory Visit (HOSPITAL_COMMUNITY): Admission: RE | Admit: 2018-02-27 | Payer: Medicare Other | Source: Ambulatory Visit

## 2018-02-27 ENCOUNTER — Encounter (HOSPITAL_COMMUNITY): Payer: Self-pay

## 2018-02-27 ENCOUNTER — Ambulatory Visit (HOSPITAL_COMMUNITY)
Admission: RE | Admit: 2018-02-27 | Discharge: 2018-02-27 | Disposition: A | Discharge status: Home / Self Care | Payer: Medicare Other | Source: Ambulatory Visit | Attending: Cardiovascular Disease | Admitting: Cardiovascular Disease

## 2018-02-27 DIAGNOSIS — I208 Other forms of angina pectoris: Secondary | ICD-10-CM | POA: Insufficient documentation

## 2018-02-27 DIAGNOSIS — R079 Chest pain, unspecified: Secondary | ICD-10-CM | POA: Diagnosis not present

## 2018-02-27 DIAGNOSIS — M79602 Pain in left arm: Secondary | ICD-10-CM | POA: Diagnosis not present

## 2018-02-27 LAB — POCT I-STAT CREATININE: CREATININE: 1 mg/dL (ref 0.61–1.24)

## 2018-02-27 MED ORDER — NITROGLYCERIN 0.4 MG SL SUBL
SUBLINGUAL_TABLET | SUBLINGUAL | Status: AC
  Filled 2018-02-27: qty 2

## 2018-02-27 MED ORDER — IOPAMIDOL (ISOVUE-370) INJECTION 76%
80.0000 mL | Freq: Once | INTRAVENOUS | Status: AC | PRN
  Administered 2018-02-27: 80 mL via INTRAVENOUS

## 2018-02-27 MED ORDER — NITROGLYCERIN 0.4 MG SL SUBL
0.8000 mg | SUBLINGUAL_TABLET | Freq: Once | SUBLINGUAL | Status: AC
Start: 2018-02-27 — End: 2018-02-27
  Administered 2018-02-27: 0.8 mg via SUBLINGUAL
  Filled 2018-02-27: qty 25

## 2018-03-02 ENCOUNTER — Encounter: Payer: Self-pay | Admitting: Cardiovascular Disease

## 2018-03-05 DIAGNOSIS — K210 Gastro-esophageal reflux disease with esophagitis: Secondary | ICD-10-CM | POA: Diagnosis not present

## 2018-03-05 DIAGNOSIS — R198 Other specified symptoms and signs involving the digestive system and abdomen: Secondary | ICD-10-CM | POA: Diagnosis not present

## 2018-03-20 ENCOUNTER — Other Ambulatory Visit: Payer: Self-pay | Admitting: Gastroenterology

## 2018-03-20 DIAGNOSIS — R198 Other specified symptoms and signs involving the digestive system and abdomen: Secondary | ICD-10-CM

## 2018-03-20 DIAGNOSIS — R109 Unspecified abdominal pain: Secondary | ICD-10-CM

## 2018-03-23 ENCOUNTER — Ambulatory Visit
Admission: RE | Admit: 2018-03-23 | Discharge: 2018-03-23 | Disposition: A | Discharge status: Home / Self Care | Payer: Medicare Other | Source: Ambulatory Visit | Attending: Gastroenterology | Admitting: Gastroenterology

## 2018-03-23 DIAGNOSIS — R109 Unspecified abdominal pain: Secondary | ICD-10-CM

## 2018-03-23 DIAGNOSIS — R198 Other specified symptoms and signs involving the digestive system and abdomen: Secondary | ICD-10-CM

## 2018-03-23 DIAGNOSIS — R140 Abdominal distension (gaseous): Secondary | ICD-10-CM | POA: Diagnosis not present

## 2018-03-23 DIAGNOSIS — K7689 Other specified diseases of liver: Secondary | ICD-10-CM | POA: Diagnosis not present

## 2018-03-23 MED ORDER — IOPAMIDOL (ISOVUE-300) INJECTION 61%
100.0000 mL | Freq: Once | INTRAVENOUS | Status: AC | PRN
  Administered 2018-03-23: 100 mL via INTRAVENOUS

## 2018-03-24 ENCOUNTER — Encounter: Payer: Self-pay | Admitting: Cardiovascular Disease

## 2018-03-24 ENCOUNTER — Ambulatory Visit (INDEPENDENT_AMBULATORY_CARE_PROVIDER_SITE_OTHER): Payer: Medicare Other | Admitting: Cardiovascular Disease

## 2018-03-24 VITALS — BP 124/64 | HR 63 | Ht 74.0 in | Wt 184.0 lb

## 2018-03-24 DIAGNOSIS — I493 Ventricular premature depolarization: Secondary | ICD-10-CM

## 2018-03-24 DIAGNOSIS — I208 Other forms of angina pectoris: Secondary | ICD-10-CM | POA: Diagnosis not present

## 2018-03-24 DIAGNOSIS — I10 Essential (primary) hypertension: Secondary | ICD-10-CM

## 2018-03-24 NOTE — Patient Instructions (Signed)
Medication Instructions:  Your physician recommends that you continue on your current medications as directed. Please refer to the Current Medication list given to you today.  Labwork: None  Testing/Procedures: None  Follow-Up: Your physician wants you to follow-up in: 6 months with Dr. Nahser.  You will receive a reminder letter in the mail two months in advance. If you don't receive a letter, please call our office to schedule the follow-up appointment.   Any Other Special Instructions Will Be Listed Below (If Applicable).     If you need a refill on your cardiac medications before your next appointment, please call your pharmacy.   

## 2018-03-24 NOTE — Progress Notes (Signed)
Gregory Hill Date of Birth  [birth year] [place]  [address]    [place]      [place], [place]  [address]            Problem List: 1. Palpitations 2. Premature ventricular contractions 3. Paroxysmal atrial fibrillation 4. Dyslipidemia 5. Hypertension   Gregory Hill is a 67 y.o. yo with a hx of palpitations, HTN, hyperlipidemia, transient atrial fibrillation.  He has a high stress job. He's noted episodes of tachycardia recently - as fast as 120 bmp.  He still walks regularly - 2-3 miles several times a week.  He has a strong family history of sudden cardiac death.  He's worn an event monitor several years ago which revealed normal sinus rhythm with occasional premature ventricular contractions.  His palpitations have improved some over the past several months. They have improved with propranolol and now is on Metoprolol 12.5 BID.  October 06, 2012:  He is doing well.  Still having some palpitations.  He is still walking regularly.   He is staying hydrated.  He is not having any problems with his palpitations.   Nov. 20, 2014:  Gregory Hill presents with persistent back pain for the past several weeks.  He has been in [place] watching his son play basketball.  He has had back pain for 3-4 weeks, associated with nausea ( especially when he has increase back / flank ache).  Does not feel like a ripping or tearing sensation.  Not worsened with twisting or turning his torso.   He has walked regularly - no CP or worsening of his back pain.   No pleuretic Cp.  Has had some leg cramping in his legs recently.  The pain acutely worsens with lying down ( increased significantly when I had him lie supine)    Jan. 12, 2015:  Gregory Hill continues to have intermittent back and chest pain.  Seems to get worse if he stands up or changes position.    Chest CT in mid Dec. was normal.   Still exercising.  Regularly.  2-3 miles each time.    February 07, 2014:  02/16/2015: Gregory Hill is seen  today for follow-up visit. He's had a history of hypertension. He's had paroxysmal atrial fibrillation in the remote past.  Has a history of hyperlipidemia and has been on Crestor. He's been having some leg cramps recently.  Gregory Hill thinks that the calf cramps are more related to his back issues and not the statin . The cramps are much better than several months .  Has had lots of back issues. Was going to have surgery but now his back is better.   His potassium levels have been borderline low.  Eats a banana every day . Drinks a V-8 occasionally.  Nov. 21, 2016:  Gregory Hill is here with some left shoulder pain .  Pain radiates down left arm to wrist. Has been traveling quite a bit.    Did lot of lifting 10 days ago. Able to walk several miles without any problems .   November 22, 2015:  Gregory Hill is here to discuss more palpitations ? Due to allergies.  Has some skipped beats.   Goes away.   Then recurs several hours later.  Sometime occurs after eating  Or after lying down in bed at the end of the day  HR has been slower   Dec 22, 2015:  Gregory Hill is here for a work in  visit for more palpitations. Remembered today that he had forgotten to take his metoprolol that day .   Walks 3 miles a day without problems.   Has PVCs - more when he eats.  More when he is sitting quitely at night.  Takes propropralol on occasion Has also had a brief run of tachycardia .  Lasts only 2-3 seconds  Does not want to wear a monitor .   Oct. 30, 2017:  Feeling better.   Has some PVCs.  They seem to have calmed down. Has not been doing much exercise Had some tachycardia playing golf.    Did not feel faint .  Brought his labs from work   November 13, 2016:  Gregory Hill is seen today for follow-up of his hypertension, patient's, paroxysmal atrial fibrillation. He also has a history of hyperlipidemia that has been well controlled on Crestor.  Has occasional palpitations - especially at night Still walks regularly . Is  thinking about retiring within the next 12 months    Oct. 22, 2018:  Has set his retirement day - 07/28/17 Is doing well Has had some palpitations - have eased some  Still walking regularly , no episodes of AF  Lipomed profile - LDL particle number 1003.  Insulin IR score = 76 ( normal is < 45)   Feb. 8, 2019:  Gregory Hill is doing great. Has retired  Is learning to swim , work outs are tough.   No CP or dyspnea. Has PVCs  Is on rosuvastatin .   We tried stopping Niaspan and his HDL decreased dramatically so we have restarted.   Feb. 11, 2019:  Gregory Hill is seen as a work in  Had an episode of PAF last night.  His symptoms were completely different than his PVCs.  He felt that his heart rate was very irregular. Lasted 30 min.  No real cp or dyspnea  Took a propranolol with resolution of palpitatoins in 30 min  Aug. 27, 2019: Gregory Hill is doing well.  I saw him in February for an episode of palpitations that was thought to be paroxysmal atrial fibrillation. Palpitations have been better. As of his intermittent episodes of chest discomfort we did a CT angiogram.  His coronary calcium score was 5. ( 19th percentile of age and gender )  He had minimal plaque in his coronary arteries but nothing significant.  Enjoying retirement.   Exercising regularly .     Current Outpatient Medications on File Prior to Visit  Medication Sig Dispense Refill  . aspirin 81 MG tablet Take 81 mg by mouth daily.      . cholecalciferol (VITAMIN D) 1000 UNITS tablet Take 1,000 Units by mouth daily.     . fenofibrate (TRICOR) 145 MG tablet Take 1 tablet (145 mg total) by mouth daily. 90 tablet 3  . metoprolol tartrate (LOPRESSOR) 25 MG tablet Take 0.5 tablets (12.5 mg total) by mouth 2 (two) times daily. 90 tablet 3  . Multiple Vitamin (MULTIVITAMIN) tablet Take 1 tablet by mouth daily.      . niacin (NIASPAN) 1000 MG CR tablet Take 1 tablet (1,000 mg total) by mouth at bedtime. 90 tablet 3  . omega-3 acid ethyl  esters (LOVAZA) 1 g capsule Take 3 capsules (3 g total) by mouth daily. 270 capsule 3  . propranolol (INDERAL) 10 MG tablet Take 10 mg by mouth 4 (four) times daily as needed (RAPID HEARTRATE).    . ramipril (ALTACE) 5 MG capsule Take 1 capsule (5 mg  total) by mouth 2 (two) times daily. 180 capsule 3  . rosuvastatin (CRESTOR) 10 MG tablet Take 1 tablet (10 mg total) by mouth daily. 90 tablet 3  . valACYclovir (VALTREX) 1000 MG tablet Take 1 g by mouth 2 (two) times daily as needed. AS NEEDED FOR BREAKOUTS  1   No current facility-administered medications on file prior to visit.      Allergies  Allergen Reactions  . Azithromycin     As child   . Other   . Bystolic [Nebivolol Hcl]     Past Medical History:  Diagnosis Date  . Anxiety   . Atrial fibrillation ([place])   . Hyperlipidemia   . Hypertension   . PVC (premature ventricular contraction)     Past Surgical History:  Procedure Laterality Date  . none      Social History   Tobacco Use  Smoking Status Never Smoker  Smokeless Tobacco Never Used    Social History   Substance and Sexual Activity  Alcohol Use Yes  . Alcohol/week: 3.0 - 5.0 standard drinks  . Types: 3 - 5 drink(s) per week    Family History  Problem Relation Age of Onset  . Heart disease Mother   . Heart attack Mother     Reviw of Systems:  Noted in current hx .    Physical Exam: Blood pressure 124/64, pulse 63, height 6\' 2"  (1.88 m), weight 184 lb (83.5 kg), SpO2 97 %.  GEN:   Thin middle age man,  NAD  HEENT: Normal NECK: No JVD; No carotid bruits LYMPHATICS: No lymphadenopathy CARDIAC: RR,  RESPIRATORY:  Clear to auscultation without rales, wheezing or rhonchi  ABDOMEN: Soft, non-tender, non-distended MUSCULOSKELETAL:  No edema; No deformity  SKIN: Warm and dry NEUROLOGIC:  Alert and oriented x 3    ECG:       Assessment / Plan:      1. Paroxysmal atrial fibrillation -     No recent palpitations   2.  Chest pain :    Coronary CT angiogram shows a coronary calcium score of 5 which is 19th percentile for age gender patients.  He has minimal plaque in the left main but no real obstructive disease.  It appears that his chest pains are noncardiac.    2.  HTN   - well controlled   3. Dyslipidemia:   Cont meds.    Mertie Moores, MD  03/24/2018 10:18 AM    [address] [place],  [place], [place]  [address] Pager [phone] Phone: [phone]; Fax: [phone]

## 2018-03-31 ENCOUNTER — Other Ambulatory Visit: Payer: Self-pay | Admitting: Cardiovascular Disease

## 2018-03-31 MED ORDER — ROSUVASTATIN CALCIUM 10 MG PO TABS: 10.0000 mg | ORAL_TABLET | Freq: Every day | ORAL | 3 refills | Status: DC

## 2018-05-04 DIAGNOSIS — M65332 Trigger finger, left middle finger: Secondary | ICD-10-CM | POA: Diagnosis not present

## 2018-05-04 DIAGNOSIS — M79645 Pain in left finger(s): Secondary | ICD-10-CM | POA: Diagnosis not present

## 2018-05-11 DIAGNOSIS — Z23 Encounter for immunization: Secondary | ICD-10-CM | POA: Diagnosis not present

## 2018-06-03 DIAGNOSIS — M79645 Pain in left finger(s): Secondary | ICD-10-CM | POA: Diagnosis not present

## 2018-06-03 DIAGNOSIS — M65332 Trigger finger, left middle finger: Secondary | ICD-10-CM | POA: Diagnosis not present

## 2018-06-05 DIAGNOSIS — L570 Actinic keratosis: Secondary | ICD-10-CM | POA: Diagnosis not present

## 2018-06-05 DIAGNOSIS — B351 Tinea unguium: Secondary | ICD-10-CM | POA: Diagnosis not present

## 2018-06-05 DIAGNOSIS — D692 Other nonthrombocytopenic purpura: Secondary | ICD-10-CM | POA: Diagnosis not present

## 2018-06-29 ENCOUNTER — Other Ambulatory Visit: Payer: Self-pay | Admitting: Cardiovascular Disease

## 2018-07-02 DIAGNOSIS — Z23 Encounter for immunization: Secondary | ICD-10-CM | POA: Diagnosis not present

## 2018-07-30 DIAGNOSIS — M25511 Pain in right shoulder: Secondary | ICD-10-CM | POA: Diagnosis not present

## 2018-07-31 ENCOUNTER — Telehealth: Payer: Self-pay | Admitting: Cardiovascular Disease

## 2018-07-31 NOTE — Telephone Encounter (Signed)
Med list faxed per patient's request to [facility]. Patient's 6 mo f/u appointment also made. Patient denies any additional needs at this time.

## 2018-07-31 NOTE — Telephone Encounter (Signed)
New Message   Patient is calling because his prescription carrier is requesting an updated list of medications that he takes. Please call to discuss. He needs that information to be faxed to [phone]  [facility].

## 2018-08-17 DIAGNOSIS — M65332 Trigger finger, left middle finger: Secondary | ICD-10-CM | POA: Diagnosis not present

## 2018-08-17 DIAGNOSIS — M79645 Pain in left finger(s): Secondary | ICD-10-CM | POA: Diagnosis not present

## 2018-08-30 ENCOUNTER — Other Ambulatory Visit: Payer: Self-pay | Admitting: Cardiovascular Disease

## 2018-08-30 DIAGNOSIS — E785 Hyperlipidemia, unspecified: Secondary | ICD-10-CM

## 2018-09-04 DIAGNOSIS — M25511 Pain in right shoulder: Secondary | ICD-10-CM | POA: Diagnosis not present

## 2018-09-08 DIAGNOSIS — H2513 Age-related nuclear cataract, bilateral: Secondary | ICD-10-CM | POA: Diagnosis not present

## 2018-09-08 DIAGNOSIS — H0100A Unspecified blepharitis right eye, upper and lower eyelids: Secondary | ICD-10-CM | POA: Diagnosis not present

## 2018-09-08 DIAGNOSIS — H524 Presbyopia: Secondary | ICD-10-CM | POA: Diagnosis not present

## 2018-09-08 DIAGNOSIS — H5310 Unspecified subjective visual disturbances: Secondary | ICD-10-CM | POA: Diagnosis not present

## 2018-09-09 ENCOUNTER — Telehealth: Payer: Self-pay | Admitting: Cardiovascular Disease

## 2018-09-09 ENCOUNTER — Other Ambulatory Visit: Payer: Self-pay | Admitting: *Deleted

## 2018-09-09 DIAGNOSIS — E785 Hyperlipidemia, unspecified: Secondary | ICD-10-CM

## 2018-09-09 MED ORDER — ROSUVASTATIN CALCIUM 10 MG PO TABS: 10.0000 mg | ORAL_TABLET | Freq: Every day | ORAL | 1 refills | Status: DC

## 2018-09-09 MED ORDER — METOPROLOL TARTRATE 25 MG PO TABS: ORAL_TABLET | ORAL | 1 refills | Status: DC

## 2018-09-09 MED ORDER — RAMIPRIL 5 MG PO CAPS: ORAL_CAPSULE | ORAL | 1 refills | Status: DC

## 2018-09-09 MED ORDER — OMEGA-3-ACID ETHYL ESTERS 1 G PO CAPS: 3.0000 | ORAL_CAPSULE | Freq: Every day | ORAL | 1 refills | Status: DC

## 2018-09-09 MED ORDER — FENOFIBRATE 145 MG PO TABS: 145.0000 mg | ORAL_TABLET | Freq: Every day | ORAL | 1 refills | Status: DC

## 2018-09-09 MED ORDER — NIACIN ER (ANTIHYPERLIPIDEMIC) 1000 MG PO TBCR: 1000.0000 mg | EXTENDED_RELEASE_TABLET | Freq: Every day | ORAL | 1 refills | Status: DC

## 2018-09-09 NOTE — Telephone Encounter (Signed)
° °*  STAT* If patient is at the pharmacy, call can be transferred to refill team.   1. Which medications need to be refilled? (please list name of each medication and dose if known)  fenofibrate (TRICOR) 145 MG tablet metoprolol tartrate (LOPRESSOR) 25 MG tablet niacin (NIASPAN) 1000 MG CR tablet omega-3 acid ethyl esters (LOVAZA) 1 g capsule ramipril (ALTACE) 5 MG capsule rosuvastatin (CRESTOR) 10 MG tablet   2. Which pharmacy/location (including street and city if local pharmacy) is medication to be sent to?  [facility] Fax: [phone]  3. Do they need a 30 day or 90 day supply? 90 Day    Patient needs rx sent to new pharmacy

## 2018-09-14 ENCOUNTER — Telehealth: Payer: Self-pay | Admitting: Cardiovascular Disease

## 2018-09-14 DIAGNOSIS — E782 Mixed hyperlipidemia: Secondary | ICD-10-CM

## 2018-09-14 NOTE — Telephone Encounter (Signed)
Patient has appt with Dr. Acie Fredrickson on 2/25, he would like to come in on Wednesday 2/19 to have lab work done, however the lab order for lipid panel the is in has expired.  Please enter a new order.

## 2018-09-14 NOTE — Telephone Encounter (Signed)
CMET and lipids scheduled 2/19. The patient agrees with treatment plan.

## 2018-09-14 NOTE — Telephone Encounter (Signed)
Lets get a CMET and lipids

## 2018-09-16 ENCOUNTER — Other Ambulatory Visit: Payer: Medicare Other | Admitting: *Deleted

## 2018-09-16 DIAGNOSIS — E782 Mixed hyperlipidemia: Secondary | ICD-10-CM

## 2018-09-16 LAB — COMPREHENSIVE METABOLIC PANEL
A/G RATIO: 2 (ref 1.2–2.2)
ALT: 22 IU/L (ref 0–44)
AST: 26 IU/L (ref 0–40)
Albumin: 4.4 g/dL (ref 3.8–4.8)
Alkaline Phosphatase: 33 IU/L — ABNORMAL LOW (ref 39–117)
BUN/Creatinine Ratio: 18 (ref 10–24)
BUN: 20 mg/dL (ref 8–27)
Bilirubin Total: 0.7 mg/dL (ref 0.0–1.2)
CO2: 24 mmol/L (ref 20–29)
Calcium: 9.4 mg/dL (ref 8.6–10.2)
Chloride: 102 mmol/L (ref 96–106)
Creatinine, Ser: 1.12 mg/dL (ref 0.76–1.27)
GFR calc Af Amer: 77 mL/min/{1.73_m2} (ref >59)
GFR calc non Af Amer: 67 mL/min/{1.73_m2} (ref >59)
Globulin, Total: 2.2 g/dL (ref 1.5–4.5)
Glucose: 101 mg/dL — ABNORMAL HIGH (ref 65–99)
Potassium: 4 mmol/L (ref 3.5–5.2)
Sodium: 140 mmol/L (ref 134–144)
Total Protein: 6.6 g/dL (ref 6.0–8.5)

## 2018-09-16 LAB — LIPID PANEL
CHOL/HDL RATIO: 3.5 ratio (ref 0.0–5.0)
Cholesterol, Total: 116 mg/dL (ref 100–199)
HDL: 33 mg/dL — ABNORMAL LOW (ref >39)
LDL Calculated: 70 mg/dL (ref 0–99)
Triglycerides: 66 mg/dL (ref 0–149)
VLDL Cholesterol Cal: 13 mg/dL (ref 5–40)

## 2018-09-22 ENCOUNTER — Ambulatory Visit (INDEPENDENT_AMBULATORY_CARE_PROVIDER_SITE_OTHER): Payer: Medicare Other | Admitting: Cardiovascular Disease

## 2018-09-22 ENCOUNTER — Ambulatory Visit: Payer: Medicare Other | Admitting: Cardiovascular Disease

## 2018-09-22 ENCOUNTER — Encounter: Payer: Self-pay | Admitting: Cardiovascular Disease

## 2018-09-22 VITALS — BP 132/76 | HR 59 | Ht 74.0 in | Wt 188.8 lb

## 2018-09-22 DIAGNOSIS — I493 Ventricular premature depolarization: Secondary | ICD-10-CM | POA: Diagnosis not present

## 2018-09-22 DIAGNOSIS — E782 Mixed hyperlipidemia: Secondary | ICD-10-CM | POA: Diagnosis not present

## 2018-09-22 DIAGNOSIS — I480 Paroxysmal atrial fibrillation: Secondary | ICD-10-CM

## 2018-09-22 DIAGNOSIS — I10 Essential (primary) hypertension: Secondary | ICD-10-CM | POA: Diagnosis not present

## 2018-09-22 MED ORDER — PROPRANOLOL HCL 10 MG PO TABS: 10.0000 mg | ORAL_TABLET | Freq: Four times a day (QID) | ORAL | 11 refills | Status: DC | PRN

## 2018-09-22 MED ORDER — OMEGA-3-ACID ETHYL ESTERS 1 G PO CAPS: 3.0000 | ORAL_CAPSULE | Freq: Every day | ORAL | 3 refills | Status: DC

## 2018-09-22 NOTE — Progress Notes (Signed)
Gregory Hill Date of Birth  [birth year] [place]  [address]    [place]      [place], [place]  [address]            Problem List: 1. Palpitations 2. Premature ventricular contractions 3. Paroxysmal atrial fibrillation 4. Dyslipidemia 5. Hypertension   Gregory Hill is a 68 y.o. yo with a hx of palpitations, HTN, hyperlipidemia, transient atrial fibrillation.  He has a high stress job. He's noted episodes of tachycardia recently - as fast as 120 bmp.  He still walks regularly - 2-3 miles several times a week.  He has a strong family history of sudden cardiac death.  He's worn an event monitor several years ago which revealed normal sinus rhythm with occasional premature ventricular contractions.  His palpitations have improved some over the past several months. They have improved with propranolol and now is on Metoprolol 12.5 BID.  October 06, 2012:  He is doing well.  Still having some palpitations.  He is still walking regularly.   He is staying hydrated.  He is not having any problems with his palpitations.   Nov. 20, 2014:  Gregory Hill presents with persistent back pain for the past several weeks.  He has been in [place] watching his son play basketball.  He has had back pain for 3-4 weeks, associated with nausea ( especially when he has increase back / flank ache).  Does not feel like a ripping or tearing sensation.  Not worsened with twisting or turning his torso.   He has walked regularly - no CP or worsening of his back pain.   No pleuretic Cp.  Has had some leg cramping in his legs recently.  The pain acutely worsens with lying down ( increased significantly when I had him lie supine)    Jan. 12, 2015:  Gregory Hill continues to have intermittent back and chest pain.  Seems to get worse if he stands up or changes position.    Chest CT in mid Dec. was normal.   Still exercising.  Regularly.  2-3 miles each time.    February 07, 2014:  02/16/2015: Gregory Hill is seen  today for follow-up visit. He's had a history of hypertension. He's had paroxysmal atrial fibrillation in the remote past.  Has a history of hyperlipidemia and has been on Crestor. He's been having some leg cramps recently.  Gregory Hill thinks that the calf cramps are more related to his back issues and not the statin . The cramps are much better than several months .  Has had lots of back issues. Was going to have surgery but now his back is better.   His potassium levels have been borderline low.  Eats a banana every day . Drinks a V-8 occasionally.  Nov. 21, 2016:  Gregory Hill is here with some left shoulder pain .  Pain radiates down left arm to wrist. Has been traveling quite a bit.    Did lot of lifting 10 days ago. Able to walk several miles without any problems .   November 22, 2015:  Gregory Hill is here to discuss more palpitations ? Due to allergies.  Has some skipped beats.   Goes away.   Then recurs several hours later.  Sometime occurs after eating  Or after lying down in bed at the end of the day  HR has been slower   Dec 22, 2015:  Gregory Hill is here for a work in  visit for more palpitations. Remembered today that he had forgotten to take his metoprolol that day .   Walks 3 miles a day without problems.   Has PVCs - more when he eats.  More when he is sitting quitely at night.  Takes propropralol on occasion Has also had a brief run of tachycardia .  Lasts only 2-3 seconds  Does not want to wear a monitor .   Oct. 30, 2017:  Feeling better.   Has some PVCs.  They seem to have calmed down. Has not been doing much exercise Had some tachycardia playing golf.    Did not feel faint .  Brought his labs from work   November 13, 2016:  Gregory Hill is seen today for follow-up of his hypertension, patient's, paroxysmal atrial fibrillation. He also has a history of hyperlipidemia that has been well controlled on Crestor.  Has occasional palpitations - especially at night Still walks regularly . Is  thinking about retiring within the next 12 months    Oct. 22, 2018:  Has set his retirement day - 07/28/17 Is doing well Has had some palpitations - have eased some  Still walking regularly , no episodes of AF  Lipomed profile - LDL particle number 1003.  Insulin IR score = 76 ( normal is < 45)   Feb. 8, 2019:  Gregory Hill is doing great. Has retired  Is learning to swim , work outs are tough.   No CP or dyspnea. Has PVCs  Is on rosuvastatin .   We tried stopping Niaspan and his HDL decreased dramatically so we have restarted.   Feb. 11, 2019:  Gregory Hill is seen as a work in  Had an episode of PAF last night.  His symptoms were completely different than his PVCs.  He felt that his heart rate was very irregular. Lasted 30 min.  No real cp or dyspnea  Took a propranolol with resolution of palpitatoins in 30 min  Aug. 27, 2019: Gregory Hill is doing well.  I saw him in February for an episode of palpitations that was thought to be paroxysmal atrial fibrillation. Palpitations have been better. As of his intermittent episodes of chest discomfort we did a CT angiogram.  His coronary calcium score was 5. ( 19th percentile of age and gender )  He had minimal plaque in his coronary arteries but nothing significant.  Enjoying retirement.   Exercising regularly .    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   Encouraged him to try Propranolol before a game   Current Outpatient Medications on File Prior to Visit  Medication Sig Dispense Refill  . aspirin 81 MG tablet Take 81 mg by mouth daily.      . cholecalciferol (VITAMIN D) 1000 UNITS tablet Take 1,000 Units by mouth daily.     . fenofibrate (TRICOR) 145 MG tablet Take 1 tablet (145 mg total) by mouth daily. 90 tablet 1  . metoprolol tartrate (LOPRESSOR) 25 MG tablet TAKE ONE-HALF TABLET BY  MOUTH TWO TIMES DAILY 90 tablet 1    . Multiple Vitamin (MULTIVITAMIN) tablet Take 1 tablet by mouth daily.      . niacin (NIASPAN) 1000 MG CR tablet Take 1 tablet (1,000 mg total) by mouth at bedtime. 90 tablet 1  . omega-3 acid ethyl esters (LOVAZA) 1 g capsule Take 3 capsules (3  g total) by mouth daily. 270 capsule 1  . propranolol (INDERAL) 10 MG tablet Take 10 mg by mouth 4 (four) times daily as needed (RAPID HEARTRATE).    . ramipril (ALTACE) 5 MG capsule TAKE 1 CAPSULE BY MOUTH TWO TIMES DAILY 180 capsule 1  . rosuvastatin (CRESTOR) 10 MG tablet Take 1 tablet (10 mg total) by mouth daily. 90 tablet 1  . valACYclovir (VALTREX) 1000 MG tablet Take 1 g by mouth 2 (two) times daily as needed. AS NEEDED FOR BREAKOUTS  1   No current facility-administered medications on file prior to visit.      Allergies  Allergen Reactions  . Azithromycin     As child   . Other   . Bystolic [Nebivolol Hcl]     Past Medical History:  Diagnosis Date  . Anxiety   . Atrial fibrillation ([place])   . Hyperlipidemia   . Hypertension   . PVC (premature ventricular contraction)     Past Surgical History:  Procedure Laterality Date  . none      Social History   Tobacco Use  Smoking Status Never Smoker  Smokeless Tobacco Never Used    Social History   Substance and Sexual Activity  Alcohol Use Yes  . Alcohol/week: 3.0 - 5.0 standard drinks  . Types: 3 - 5 drink(s) per week    Family History  Problem Relation Age of Onset  . Heart disease Mother   . Heart attack Mother     Reviw of Systems:  Noted in current hx .    Physical Exam: Blood pressure 132/76, pulse (!) 59, height 6\' 2"  (1.88 m), weight 188 lb 12.8 oz (85.6 kg), SpO2 98 %.  GEN:  Well nourished, well developed in no acute distress HEENT: Normal NECK: No JVD; No carotid bruits LYMPHATICS: No lymphadenopathy CARDIAC: RRR   RESPIRATORY:  Clear to auscultation without rales, wheezing or rhonchi  ABDOMEN: Soft, non-tender, non-distended MUSCULOSKELETAL:   No edema; No deformity  SKIN: Warm and dry NEUROLOGIC:  Alert and oriented x 3   ECG:       September 22, 2018: Sinus bradycardia 59 beats minute.  Otherwise normal EKG.  Assessment / Plan:    1. Paroxysmal atrial fibrillation -     Not had recent palpitations.  2.  Chest pain :   Coronary CT angiogram shows a coronary calcium score of 5 which is 19th percentile for age gender patients.  He has minimal plaque in the left main but no real obstructive disease.  It appears that his chest pains are noncardiac.    hes not had any episodes of CP    2.  HTN   -blood pressure remains well controlled.  Cont. Meds.   3. Dyslipidemia:   Cont meds.    Mertie Moores, MD  09/22/2018 3:43 PM    [address] [place],  [place], [place]  [address] Pager [phone] Phone: [phone]; Fax: [phone]

## 2018-09-22 NOTE — Patient Instructions (Signed)

## 2018-10-06 ENCOUNTER — Telehealth: Payer: Self-pay

## 2018-10-06 NOTE — Telephone Encounter (Signed)
**Note De-Identified Delano Scardino Obfuscation** I called [facility] [phone]) and did a [place] PA over the phone with [facility]. Reference# [id number]

## 2018-10-07 ENCOUNTER — Telehealth: Payer: Self-pay

## 2018-10-08 NOTE — Telephone Encounter (Signed)
New Message    Patient would like a nurse to call him back not sure what the medication name that he needs refilled.  Patient called yesterday but hasn't heard anything back yet.

## 2018-10-09 NOTE — Telephone Encounter (Signed)
We received a letter from [facility] stating that they have denied the pts Lovaza PA because we did no provide the pts triglyceride levels.  I called [facility] and s/w Delsa Sale who states that she is faxing [place] an appeal form so I can appeal this decision.  The pt is aware.

## 2018-10-09 NOTE — Telephone Encounter (Signed)
I have s/w the pt concerning this denial.  He does state that he has a different insurance company this year and that is why this is the first PA we have ever done on his Omega-3-acid ethyl esters.  We received the appeal form via fax. I have completed the form and faxed it back ti [facility].

## 2018-10-09 NOTE — Telephone Encounter (Signed)
I am forwarding this message to Triage as the pt is requesting an RX for Xanax. He states that he is out of town and his pharmacy's phone # is [phone].

## 2018-10-09 NOTE — Telephone Encounter (Signed)
Called patient back. Informed patient that Dr. Acie Fredrickson is out of the office today. Advised patient to call his PCP. Patient stated he would like Dr. Acie Fredrickson to handle this, that he has done so in the past. Patient stated he can wait until next week for medication. Will send message to Dr. Acie Fredrickson and his nurse.

## 2018-10-11 NOTE — Telephone Encounter (Signed)
OK to give a prescription for Xanax 0.5 mg  # 10 Sig:  Take as needed for anxiety  No refills

## 2018-10-12 MED ORDER — ALPRAZOLAM 0.5 MG PO TABS: 0.5000 mg | ORAL_TABLET | Freq: Two times a day (BID) | ORAL | 0 refills | Status: DC | PRN

## 2018-10-12 NOTE — Telephone Encounter (Signed)
I confirmed pharmacy with patient Prescription for Xanax 0.5 mg BID PRN #[id number] sent by Dr. Acie Fredrickson Patient is aware that medication will be available later today

## 2019-01-04 DIAGNOSIS — M65332 Trigger finger, left middle finger: Secondary | ICD-10-CM | POA: Diagnosis not present

## 2019-01-21 IMAGING — CT CT HEART MORP W/ CTA COR W/ SCORE W/ CA W/CM &/OR W/O CM
4 of 7 series · 8 of 20 positions shown, 9 images · IV contrast (APPLIED)
Comparison: 07/09/2013.

EXAM:
OVER-READ INTERPRETATION CT CHEST

The following report is an over-read performed by radiologist Dr.
over-read does not include interpretation of cardiac or coronary
anatomy or pathology. The coronary calcium score/coronary CTA
interpretation by the cardiologist is attached.
CLINICAL DATA: Chest pain
Cardiac CTA
MEDICATIONS:
Sub lingual nitro. 4mg x 2
TECHNIQUE: The patient was scanned on a Siemens [REDACTED]ice scanner. Gantry
rotation speed was 250 msecs. Collimation was 0.8 mm. A 100 kV
prospective scan was triggered in the ascending thoracic aorta at
35-75% of the R-R interval. Average HR during the scan was 60 bpm.
The 3D data set was interpreted on a dedicated work station using
MPR, MIP and VRT modes. A total of 80cc of contrast was used.

[Series 6: best diast 70 % · axial · 0.33mm/px · z∈[+1210,+1257]mm · 2 of 353 slices shown, 3 images]
[im 118/353  vessel]
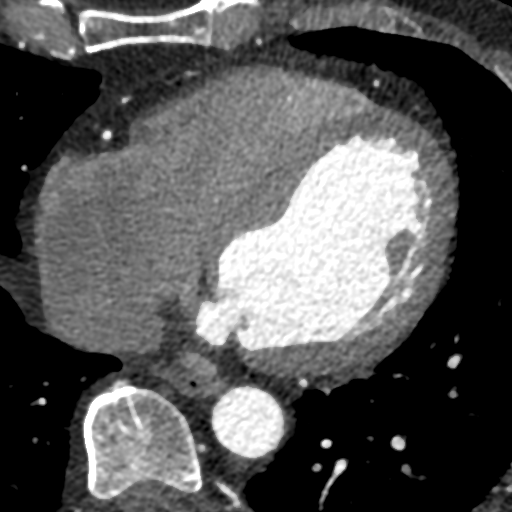
[im 118/353  lung]
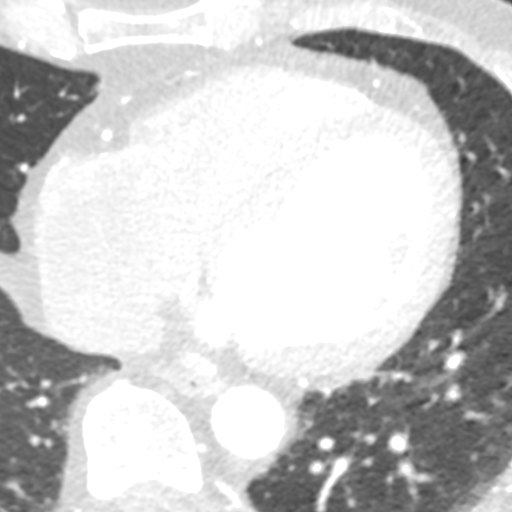
[im 235/353  vessel]
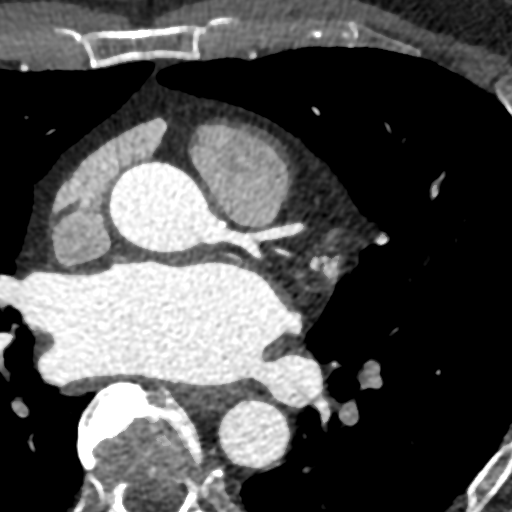

[Series 7: best syst 38 % · axial · 0.33mm/px · z∈[+1210,+1257]mm · 2 of 353 slices shown]
[im 118/353  vessel]
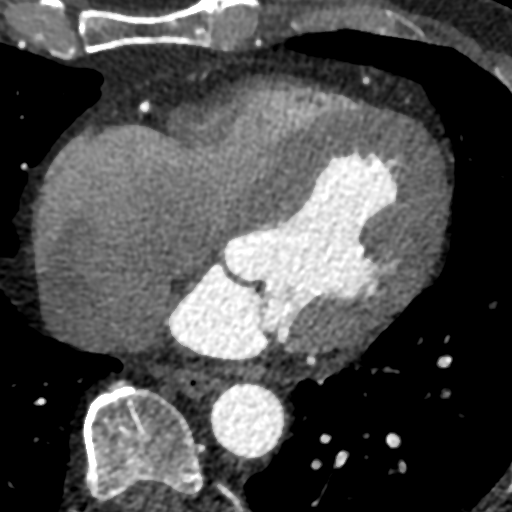
[im 235/353  vessel]
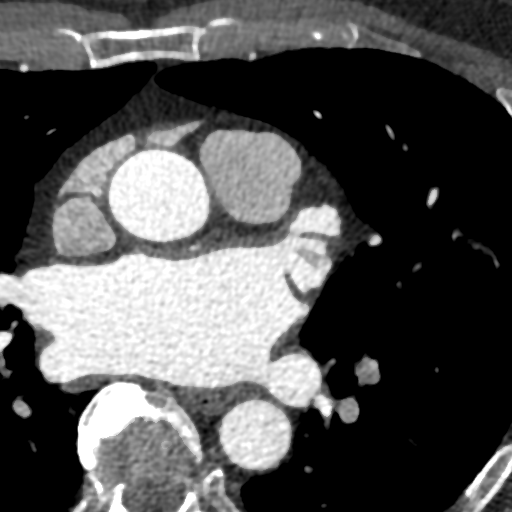

[Series 8: ts diast sharp 70 % · axial · 0.33mm/px · z∈[+1210,+1257]mm · 2 of 353 slices shown]
[im 118/353  lung]
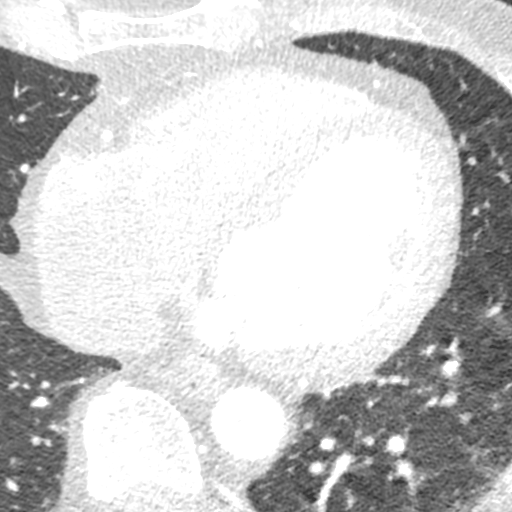
[im 235/353  lung]
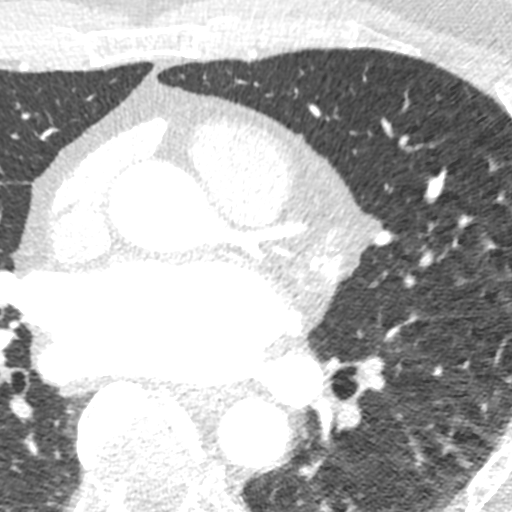

[Series 9: ts syst sharp 38 % · axial · 0.33mm/px · z∈[+1210,+1257]mm · 2 of 353 slices shown]
[im 118/353  lung]
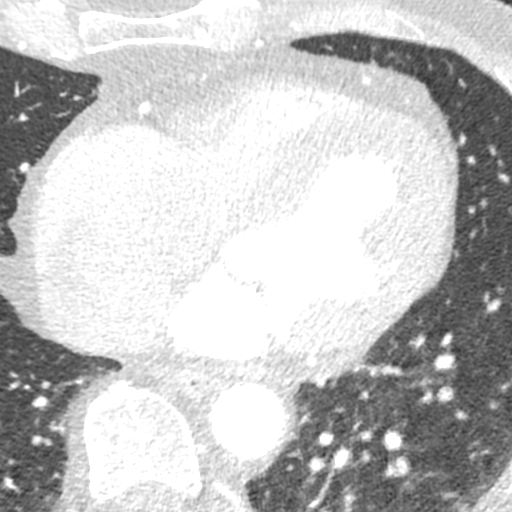
[im 235/353  lung]
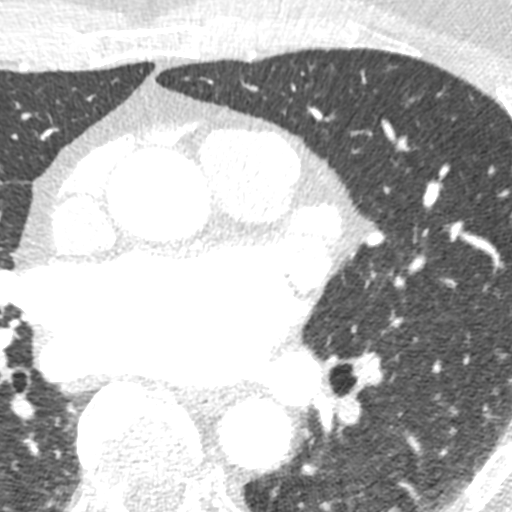

[8 of 20 positions shown; findings below may reference images not displayed]

FINDINGS: Vascular: No visible atherosclerosis and no evidence of aneurysm
involving the visualized aorta.

Mediastinum/Nodes: No pathologic lymphadenopathy within the
visualized mediastinum. Visualized esophagus normal in appearance.

Lungs/Pleura: Minimal linear atelectasis or scarring involving the
LEFT LOWER LOBE. Visualized lung parenchyma otherwise clear. No
pleural effusions. Central airways patent without significant
bronchial wall thickening.

Musculoskeletal: Degenerative changes and DISH involving the
visualized thoracic spine. No acute findings.
IMPRESSION: No significant extracardiac findings.
FINDINGS: Non-cardiac: See separate report from [REDACTED].

Calcium Score: 5 Agatston units.

Coronary Arteries: Right dominant with no anomalies

LM: Small area of calcified plaque, no stenosis.

LAD system: No plaque or stenosis.

Circumflex system: No plaque or stenosis.

RCA system: Small area of calcified plaque mid RCA, no stenosis.
IMPRESSION: 1. Coronary artery calcium score 5 Agatston units. This places the
patient in the 19th percentile for age and gender. This suggests low
risk for future cardiac events.

2.  No obstructive CAD.

Soco Goldblatt

## 2019-02-03 DIAGNOSIS — H43812 Vitreous degeneration, left eye: Secondary | ICD-10-CM | POA: Diagnosis not present

## 2019-02-04 DIAGNOSIS — H43812 Vitreous degeneration, left eye: Secondary | ICD-10-CM | POA: Diagnosis not present

## 2019-02-10 DIAGNOSIS — M65332 Trigger finger, left middle finger: Secondary | ICD-10-CM | POA: Diagnosis not present

## 2019-02-10 DIAGNOSIS — M79644 Pain in right finger(s): Secondary | ICD-10-CM | POA: Diagnosis not present

## 2019-03-29 ENCOUNTER — Other Ambulatory Visit: Payer: Self-pay | Admitting: Nurse Practitioner

## 2019-03-29 DIAGNOSIS — I10 Essential (primary) hypertension: Secondary | ICD-10-CM

## 2019-03-29 DIAGNOSIS — I480 Paroxysmal atrial fibrillation: Secondary | ICD-10-CM

## 2019-03-29 DIAGNOSIS — E782 Mixed hyperlipidemia: Secondary | ICD-10-CM

## 2019-03-29 DIAGNOSIS — I208 Other forms of angina pectoris: Secondary | ICD-10-CM

## 2019-03-29 DIAGNOSIS — I493 Ventricular premature depolarization: Secondary | ICD-10-CM

## 2019-03-29 DIAGNOSIS — Z0000 Encounter for general adult medical examination without abnormal findings: Secondary | ICD-10-CM

## 2019-03-31 ENCOUNTER — Ambulatory Visit (INDEPENDENT_AMBULATORY_CARE_PROVIDER_SITE_OTHER): Payer: Medicare Other | Admitting: Cardiovascular Disease

## 2019-03-31 ENCOUNTER — Other Ambulatory Visit: Payer: Self-pay

## 2019-03-31 ENCOUNTER — Encounter: Payer: Self-pay | Admitting: Cardiovascular Disease

## 2019-03-31 ENCOUNTER — Other Ambulatory Visit: Payer: Self-pay | Admitting: Cardiovascular Disease

## 2019-03-31 VITALS — BP 128/74 | HR 59 | Ht 74.0 in | Wt 184.1 lb

## 2019-03-31 DIAGNOSIS — Z0000 Encounter for general adult medical examination without abnormal findings: Secondary | ICD-10-CM | POA: Diagnosis not present

## 2019-03-31 DIAGNOSIS — I208 Other forms of angina pectoris: Secondary | ICD-10-CM

## 2019-03-31 DIAGNOSIS — I10 Essential (primary) hypertension: Secondary | ICD-10-CM | POA: Diagnosis not present

## 2019-03-31 DIAGNOSIS — Z23 Encounter for immunization: Secondary | ICD-10-CM | POA: Diagnosis not present

## 2019-03-31 DIAGNOSIS — L814 Other melanin hyperpigmentation: Secondary | ICD-10-CM | POA: Diagnosis not present

## 2019-03-31 DIAGNOSIS — E785 Hyperlipidemia, unspecified: Secondary | ICD-10-CM

## 2019-03-31 DIAGNOSIS — I493 Ventricular premature depolarization: Secondary | ICD-10-CM | POA: Diagnosis not present

## 2019-03-31 DIAGNOSIS — D1801 Hemangioma of skin and subcutaneous tissue: Secondary | ICD-10-CM | POA: Diagnosis not present

## 2019-03-31 DIAGNOSIS — E782 Mixed hyperlipidemia: Secondary | ICD-10-CM

## 2019-03-31 DIAGNOSIS — L821 Other seborrheic keratosis: Secondary | ICD-10-CM | POA: Diagnosis not present

## 2019-03-31 DIAGNOSIS — I480 Paroxysmal atrial fibrillation: Secondary | ICD-10-CM

## 2019-03-31 DIAGNOSIS — L570 Actinic keratosis: Secondary | ICD-10-CM | POA: Diagnosis not present

## 2019-03-31 DIAGNOSIS — D225 Melanocytic nevi of trunk: Secondary | ICD-10-CM | POA: Diagnosis not present

## 2019-03-31 DIAGNOSIS — D692 Other nonthrombocytopenic purpura: Secondary | ICD-10-CM | POA: Diagnosis not present

## 2019-03-31 LAB — HEPATIC FUNCTION PANEL
ALT: 21 IU/L (ref 0–44)
AST: 29 IU/L (ref 0–40)
Albumin: 4.7 g/dL (ref 3.8–4.8)
Alkaline Phosphatase: 33 IU/L — ABNORMAL LOW (ref 39–117)
Bilirubin Total: 0.9 mg/dL (ref 0.0–1.2)
Bilirubin, Direct: 0.25 mg/dL (ref 0.00–0.40)
Total Protein: 6.8 g/dL (ref 6.0–8.5)

## 2019-03-31 LAB — CBC
Hematocrit: 38 % (ref 37.5–51.0)
Hemoglobin: 12.6 g/dL — ABNORMAL LOW (ref 13.0–17.7)
MCH: 32.1 pg (ref 26.6–33.0)
MCHC: 33.2 g/dL (ref 31.5–35.7)
MCV: 97 fL (ref 79–97)
Platelets: 184 10*3/uL (ref 150–450)
RBC: 3.93 x10E6/uL — ABNORMAL LOW (ref 4.14–5.80)
RDW: 13.5 % (ref 11.6–15.4)
WBC: 4.6 10*3/uL (ref 3.4–10.8)

## 2019-03-31 LAB — BASIC METABOLIC PANEL
BUN/Creatinine Ratio: 20 (ref 10–24)
BUN: 21 mg/dL (ref 8–27)
CO2: 23 mmol/L (ref 20–29)
Calcium: 9.4 mg/dL (ref 8.6–10.2)
Chloride: 101 mmol/L (ref 96–106)
Creatinine, Ser: 1.04 mg/dL (ref 0.76–1.27)
GFR calc Af Amer: 84 mL/min/{1.73_m2} (ref >59)
GFR calc non Af Amer: 72 mL/min/{1.73_m2} (ref >59)
Glucose: 94 mg/dL (ref 65–99)
Potassium: 3.8 mmol/L (ref 3.5–5.2)
Sodium: 136 mmol/L (ref 134–144)

## 2019-03-31 LAB — TSH: TSH: 3.48 u[IU]/mL (ref 0.450–4.500)

## 2019-03-31 LAB — LIPID PANEL
Chol/HDL Ratio: 3.7 ratio (ref 0.0–5.0)
Cholesterol, Total: 108 mg/dL (ref 100–199)
HDL: 29 mg/dL — ABNORMAL LOW (ref >39)
LDL Chol Calc (NIH): 64 mg/dL (ref 0–99)
Triglycerides: 73 mg/dL (ref 0–149)
VLDL Cholesterol Cal: 15 mg/dL (ref 5–40)

## 2019-03-31 NOTE — Progress Notes (Signed)
[name] Date of Birth  Dec 28, 1950 [place]  [id number]    [place]      [place], [place]  [address]            Problem List: 1. Palpitations 2. Premature ventricular contractions 3. Paroxysmal atrial fibrillation 4. Dyslipidemia 5. Hypertension   Lennette Bihari is a 68 y.o. yo with a hx of palpitations, HTN, hyperlipidemia, transient atrial fibrillation.  He has a high stress job. He's noted episodes of tachycardia recently - as fast as 120 bmp.  He still walks regularly - 2-3 miles several times a week.  He has a strong family history of sudden cardiac death.  He's worn an event monitor several years ago which revealed normal sinus rhythm with occasional premature ventricular contractions.  His palpitations have improved some over the past several months. They have improved with propranolol and now is on Metoprolol 12.5 BID.  October 06, 2012:  He is doing well.  Still having some palpitations.  He is still walking regularly.   He is staying hydrated.  He is not having any problems with his palpitations.   Nov. 20, 2014:  Lennette Bihari presents with persistent back pain for the past several weeks.  He has been in [place] watching his son play basketball.  He has had back pain for 3-4 weeks, associated with nausea ( especially when he has increase back / flank ache).  Does not feel like a ripping or tearing sensation.  Not worsened with twisting or turning his torso.   He has walked regularly - no CP or worsening of his back pain.   No pleuretic Cp.  Has had some leg cramping in his legs recently.  The pain acutely worsens with lying down ( increased significantly when I had him lie supine)    Jan. 12, 2015:  Lennette Bihari continues to have intermittent back and chest pain.  Seems to get worse if he stands up or changes position.    Chest CT in mid Dec. was normal.   Still exercising.  Regularly.  2-3 miles each time.    February 07, 2014:  02/16/2015: Lennette Bihari is seen  today for follow-up visit. He's had a history of hypertension. He's had paroxysmal atrial fibrillation in the remote past.  Has a history of hyperlipidemia and has been on Crestor. He's been having some leg cramps recently.  Lennette Bihari thinks that the calf cramps are more related to his back issues and not the statin . The cramps are much better than several months .  Has had lots of back issues. Was going to have surgery but now his back is better.   His potassium levels have been borderline low.  Eats a banana every day . Drinks a V-8 occasionally.  Nov. 21, 2016:  Lennette Bihari is here with some left shoulder pain .  Pain radiates down left arm to wrist. Has been traveling quite a bit.    Did lot of lifting 10 days ago. Able to walk several miles without any problems .   November 22, 2015:  Lennette Bihari is here to discuss more palpitations ? Due to allergies.  Has some skipped beats.   Goes away.   Then recurs several hours later.  Sometime occurs after eating  Or after lying down in bed at the end of the day  HR has been slower   Dec 22, 2015:  Lennette Bihari is here for a work in  visit for more palpitations. Remembered today that he had forgotten to take his metoprolol that day .   Walks 3 miles a day without problems.   Has PVCs - more when he eats.  More when he is sitting quitely at night.  Takes propropralol on occasion Has also had a brief run of tachycardia .  Lasts only 2-3 seconds  Does not want to wear a monitor .   Oct. 30, 2017:  Feeling better.   Has some PVCs.  They seem to have calmed down. Has not been doing much exercise Had some tachycardia playing golf.    Did not feel faint .  Brought his labs from work   November 13, 2016:  Lennette Bihari is seen today for follow-up of his hypertension, patient's, paroxysmal atrial fibrillation. He also has a history of hyperlipidemia that has been well controlled on Crestor.  Has occasional palpitations - especially at night Still walks regularly . Is  thinking about retiring within the next 12 months    Oct. 22, 2018:  Has set his retirement day - 07/28/17 Is doing well Has had some palpitations - have eased some  Still walking regularly , no episodes of AF  Lipomed profile - LDL particle number 1003.  Insulin IR score = 76 ( normal is < 45)   Feb. 8, 2019:  Lennette Bihari is doing great. Has retired  Is learning to swim , work outs are tough.   No CP or dyspnea. Has PVCs  Is on rosuvastatin .   We tried stopping Niaspan and his HDL decreased dramatically so we have restarted.   Feb. 11, 2019:  Lennette Bihari is seen as a work in  Had an episode of PAF last night.  His symptoms were completely different than his PVCs.  He felt that his heart rate was very irregular. Lasted 30 min.  No real cp or dyspnea  Took a propranolol with resolution of palpitatoins in 30 min  Aug. 27, 2019: Lennette Bihari is doing well.  I saw him in February for an episode of palpitations that was thought to be paroxysmal atrial fibrillation. Palpitations have been better. As of his intermittent episodes of chest discomfort we did a CT angiogram.  His coronary calcium score was 5. ( 19th percentile of age and gender )  He had minimal plaque in his coronary arteries but nothing significant.  Enjoying retirement.   Exercising regularly .    September 22, 2018: Doing well  Wants to be busier ,  Works with [employer] ( start up [employer]) kids are great He gets really "ampped up" when Marjory Lies is couching .   Encouraged him to try Propranolol before a game   Sept. 2, 2020  Lennette Bihari is seen today .  Has had more palpitations recently palpitations have stabilized.  No cP  Walks regularly .   Coronary CT angiogram Aug. 2, 2020 - showed no CAD.   Coronary calcium score is 5 ( 19th %)   Current Outpatient Medications on File Prior to Visit  Medication Sig Dispense Refill  . aspirin 81 MG tablet Take 81 mg by mouth daily.      . cholecalciferol (VITAMIN D) 1000 UNITS tablet Take  1,000 Units by mouth daily.     . fenofibrate (TRICOR) 145 MG tablet Take 1 tablet (145 mg total) by mouth daily. 90 tablet 1  . metoprolol tartrate (LOPRESSOR) 25 MG tablet TAKE ONE-HALF TABLET BY  MOUTH TWO TIMES DAILY 90 tablet 1  . Multiple  Vitamin (MULTIVITAMIN) tablet Take 1 tablet by mouth daily.      . niacin (NIASPAN) 1000 MG CR tablet Take 1 tablet (1,000 mg total) by mouth at bedtime. 90 tablet 1  . omega-3 acid ethyl esters (LOVAZA) 1 g capsule Take 3 capsules (3 g total) by mouth daily. 270 capsule 3  . propranolol (INDERAL) 10 MG tablet Take 1 tablet (10 mg total) by mouth 4 (four) times daily as needed (RAPID HEARTRATE). 30 tablet 11  . ramipril (ALTACE) 5 MG capsule TAKE 1 CAPSULE BY MOUTH TWO TIMES DAILY 180 capsule 1  . rosuvastatin (CRESTOR) 10 MG tablet Take 1 tablet (10 mg total) by mouth daily. 90 tablet 1  . valACYclovir (VALTREX) 1000 MG tablet Take 1 g by mouth 2 (two) times daily as needed. AS NEEDED FOR BREAKOUTS  1   No current facility-administered medications on file prior to visit.      Allergies  Allergen Reactions  . Azithromycin     As child   . Other   . Bystolic [Nebivolol Hcl]     Past Medical History:  Diagnosis Date  . Anxiety   . Atrial fibrillation ([place])   . Hyperlipidemia   . Hypertension   . PVC (premature ventricular contraction)     Past Surgical History:  Procedure Laterality Date  . none      Social History   Tobacco Use  Smoking Status Never Smoker  Smokeless Tobacco Never Used    Social History   Substance and Sexual Activity  Alcohol Use Yes  . Alcohol/week: 3.0 - 5.0 standard drinks  . Types: 3 - 5 drink(s) per week    Family History  Problem Relation Age of Onset  . Heart disease Mother   . Heart attack Mother     Reviw of Systems:  Noted in current hx .   Physical Exam: Blood pressure 128/74, pulse (!) 59, height 6\' 2"  (1.88 m), weight 184 lb 1.9 oz (83.5 kg), SpO2 98 %.  GEN:  Well nourished, well  developed in no acute distress HEENT: Normal NECK: No JVD; No carotid bruits LYMPHATICS: No lymphadenopathy CARDIAC: RRR  RESPIRATORY:  Clear to auscultation without rales, wheezing or rhonchi  ABDOMEN: Soft, non-tender, non-distended MUSCULOSKELETAL:  No edema; No deformity  SKIN: Warm and dry NEUROLOGIC:  Alert and oriented x 3    ECG:       Sept. 2, 2020 :  Sinus brady at 59.  RAD , no ST or T wave change. .  Assessment / Plan:    1. Paroxysmal atrial fibrillation -     Has some PVCs.  No recurrent AFib   2.  Chest pain :  CP has resolved.   Coronary CT scan revealed no evidence of significant coronary artery disease.  His coronary calcium score is 5 which places him in the 19th percentile for age/sex matched controls.  I do not think that his chest pain is due to acute coronary syndrome. He is  Exercising regularly    2.  HTN   -BP is well controlled.   Cont meds .   3. Dyslipidemia:    Check lipids , liver, cmet today  Continue crestor  Coronary carlicum score of 5 .    Mertie Moores, MD  03/31/2019 8:30 AM    [place],  [place], [place]  [address] Pager [phone] Phone: [phone]; Fax: [phone]

## 2019-03-31 NOTE — Patient Instructions (Signed)
Medication Instructions:  No changes today If you need a refill on your cardiac medications before your next appointment, please call your pharmacy.   Lab work: Today: cbc, bmet, lipids, liver, tsh, urinalysis, microalbumin  If you have labs (blood work) drawn today and your tests are completely normal, you will receive your results only by: Marland Kitchen MyChart Message (if you have MyChart) OR . A paper copy in the mail If you have any lab test that is abnormal or we need to change your treatment, we will call you to review the results.  Testing/Procedures: none  Follow-Up: At [facility], you and your health needs are our priority.  As part of our continuing mission to provide you with exceptional heart care, we have created designated Provider Care Teams.  These Care Teams include your primary Cardiologist (physician) and Advanced Practice Providers (APPs -  Physician Assistants and Nurse Practitioners) who all work together to provide you with the care you need, when you need it. You will need a follow up appointment in:  6 months.  Please call our office 2 months in advance to schedule this appointment.  You may see Mertie Moores, MD or one of the following Advanced Practice Providers on your designated Care Team: Richardson Dopp, PA-C [place], [place] . Daune Perch, NP  Any Other Special Instructions Will Be Listed Below (If Applicable).

## 2019-04-01 LAB — URINALYSIS
Bilirubin, UA: NEGATIVE
Glucose, UA: NEGATIVE
Ketones, UA: NEGATIVE
Leukocytes,UA: NEGATIVE
Nitrite, UA: NEGATIVE
Protein,UA: NEGATIVE
RBC, UA: NEGATIVE
Specific Gravity, UA: 1.017 (ref 1.005–1.030)
Urobilinogen, Ur: 0.2 mg/dL (ref 0.2–1.0)
pH, UA: 7.5 (ref 5.0–7.5)

## 2019-04-01 LAB — MICROALBUMIN, URINE: Microalbumin, Urine: 4.5 ug/mL

## 2019-04-21 DIAGNOSIS — Z8679 Personal history of other diseases of the circulatory system: Secondary | ICD-10-CM | POA: Diagnosis not present

## 2019-04-21 DIAGNOSIS — K5730 Diverticulosis of large intestine without perforation or abscess without bleeding: Secondary | ICD-10-CM | POA: Diagnosis not present

## 2019-04-21 DIAGNOSIS — R0789 Other chest pain: Secondary | ICD-10-CM | POA: Diagnosis not present

## 2019-04-21 DIAGNOSIS — Z0000 Encounter for general adult medical examination without abnormal findings: Secondary | ICD-10-CM | POA: Diagnosis not present

## 2019-04-21 DIAGNOSIS — M12849 Other specific arthropathies, not elsewhere classified, unspecified hand: Secondary | ICD-10-CM | POA: Diagnosis not present

## 2019-04-21 DIAGNOSIS — K219 Gastro-esophageal reflux disease without esophagitis: Secondary | ICD-10-CM | POA: Diagnosis not present

## 2019-04-21 DIAGNOSIS — I10 Essential (primary) hypertension: Secondary | ICD-10-CM | POA: Diagnosis not present

## 2019-04-21 DIAGNOSIS — E785 Hyperlipidemia, unspecified: Secondary | ICD-10-CM | POA: Diagnosis not present

## 2019-04-21 DIAGNOSIS — R002 Palpitations: Secondary | ICD-10-CM | POA: Diagnosis not present

## 2019-04-21 DIAGNOSIS — Z1331 Encounter for screening for depression: Secondary | ICD-10-CM | POA: Diagnosis not present

## 2019-04-21 DIAGNOSIS — F419 Anxiety disorder, unspecified: Secondary | ICD-10-CM | POA: Diagnosis not present

## 2019-04-21 DIAGNOSIS — Z8249 Family history of ischemic heart disease and other diseases of the circulatory system: Secondary | ICD-10-CM | POA: Diagnosis not present

## 2019-05-14 DIAGNOSIS — H43811 Vitreous degeneration, right eye: Secondary | ICD-10-CM | POA: Diagnosis not present

## 2019-06-07 ENCOUNTER — Telehealth: Payer: Self-pay | Admitting: Nurse Practitioner

## 2019-06-07 ENCOUNTER — Other Ambulatory Visit: Payer: Self-pay | Admitting: Nurse Practitioner

## 2019-06-07 DIAGNOSIS — R7989 Other specified abnormal findings of blood chemistry: Secondary | ICD-10-CM | POA: Diagnosis not present

## 2019-06-07 DIAGNOSIS — I10 Essential (primary) hypertension: Secondary | ICD-10-CM | POA: Diagnosis not present

## 2019-06-07 DIAGNOSIS — D6489 Other specified anemias: Secondary | ICD-10-CM | POA: Diagnosis not present

## 2019-06-07 DIAGNOSIS — E7849 Other hyperlipidemia: Secondary | ICD-10-CM | POA: Diagnosis not present

## 2019-06-07 DIAGNOSIS — Z8249 Family history of ischemic heart disease and other diseases of the circulatory system: Secondary | ICD-10-CM | POA: Diagnosis not present

## 2019-06-07 DIAGNOSIS — R351 Nocturia: Secondary | ICD-10-CM | POA: Diagnosis not present

## 2019-06-07 DIAGNOSIS — Z8679 Personal history of other diseases of the circulatory system: Secondary | ICD-10-CM | POA: Diagnosis not present

## 2019-06-07 MED ORDER — VALSARTAN 160 MG PO TABS: 160.0000 mg | ORAL_TABLET | Freq: Every day | ORAL | 11 refills | Status: DC

## 2019-06-07 NOTE — Telephone Encounter (Signed)
Received message from Dr. Acie Fredrickson in response to MyChart messages sent by patient with concerns regarding high BP.   I talked to Gregory Hill DC Ramipril  Start Valsartan 160 mg a day  [facility] [address]  BMP in 3 weeks  He has an appt with Dr. Danna Hefty office at 3 pm   Rx for valsartan has been sent to patient's pharmacy and lab appointment scheduled for 11/30

## 2019-06-16 ENCOUNTER — Other Ambulatory Visit: Payer: Self-pay

## 2019-06-16 ENCOUNTER — Other Ambulatory Visit (HOSPITAL_COMMUNITY): Payer: Self-pay | Admitting: Internal Medicine

## 2019-06-16 ENCOUNTER — Ambulatory Visit (HOSPITAL_COMMUNITY)
Admission: RE | Admit: 2019-06-16 | Discharge: 2019-06-16 | Disposition: A | Discharge status: Home / Self Care | Payer: Medicare Other | Source: Ambulatory Visit | Attending: Internal Medicine | Admitting: Internal Medicine

## 2019-06-16 DIAGNOSIS — R519 Headache, unspecified: Secondary | ICD-10-CM | POA: Insufficient documentation

## 2019-06-16 DIAGNOSIS — H43813 Vitreous degeneration, bilateral: Secondary | ICD-10-CM | POA: Diagnosis not present

## 2019-06-16 DIAGNOSIS — H5203 Hypermetropia, bilateral: Secondary | ICD-10-CM | POA: Diagnosis not present

## 2019-06-16 DIAGNOSIS — H2513 Age-related nuclear cataract, bilateral: Secondary | ICD-10-CM | POA: Diagnosis not present

## 2019-06-16 DIAGNOSIS — I10 Essential (primary) hypertension: Secondary | ICD-10-CM | POA: Diagnosis not present

## 2019-06-16 DIAGNOSIS — Z8679 Personal history of other diseases of the circulatory system: Secondary | ICD-10-CM | POA: Diagnosis not present

## 2019-06-16 DIAGNOSIS — H0100A Unspecified blepharitis right eye, upper and lower eyelids: Secondary | ICD-10-CM | POA: Diagnosis not present

## 2019-06-18 ENCOUNTER — Telehealth: Payer: Self-pay | Admitting: Cardiovascular Disease

## 2019-06-18 NOTE — Telephone Encounter (Signed)
Patient c/o Palpitations:  High priority if patient c/o lightheadedness, shortness of breath, or chest pain  1) How long have you had palpitations/irregular HR/ Afib? Are you having the symptoms now? A few hours and yes he is having them now  2) Are you currently experiencing lightheadedness, SOB or CP? SOB lightheaded  3) Do you have a history of afib (atrial fibrillation) or irregular heart rhythm? Yes   4) Have you checked your BP or HR? (document readings if available): Hr is all over  5) Are you experiencing any other symptoms? Lightheaded SOB

## 2019-06-18 NOTE — Telephone Encounter (Signed)
Follow up:     The phones went out while I was talking with the patient.

## 2019-06-18 NOTE — Telephone Encounter (Signed)
Called patient back. Patient stated this morning is HR was elevated at 110, so he took his metoprolol 12.5 mg. Patient stated a couple of hours later the metoprolol had not made much of a difference. Patient stated he took propranolol. Patient stated he sat down and had a moment of panic with her HR all over the place and felt lightheaded. Patient stated he took another propranolol and his moment of panic only lasted for a second or two. Patient sated he feels fine now. HR 64 BP 111/70. Patient stated he has ordered a cardia monitor to have if this happens again. Encouraged patient to take propranolol when he first starts feeling palpitations and not to wait to take it. Will forward to Dr. Acie Fredrickson and his nurse for further advisement.

## 2019-06-19 NOTE — Telephone Encounter (Signed)
Lennette Bihari called me on my cell phone and we discussed his palpitations I suspect he may have PAF. He pad a single episode of PAF ~ 15 years ago Resolved with IV Dilt in the ER  ( with a prolonged post conversion pause prompting CPR for ~ 30 seconds) I have asked him to buy a Kardia monitor. Increase metoprolol to 25 mg po BID Continue propranolol as needed We will work him in as soon as possible. If he does have AFib, will start Eliquis

## 2019-06-21 NOTE — Telephone Encounter (Signed)
Sent patient a MyChart message offering him an appointment with Dr. Acie Fredrickson on 11/25. Will await his reply.

## 2019-06-21 NOTE — Telephone Encounter (Signed)
Communicated with patient via [place]. He is establishing care in [facility] due to the fact that he cannot travel to [facility] frequently for appointments. He is thankful for Dr. Elmarie Shiley advice and will continue to see him on a regular basis but for urgent appointments will be seen in [facility].

## 2019-06-23 ENCOUNTER — Ambulatory Visit: Payer: Medicare Other | Admitting: Cardiovascular Disease

## 2019-06-23 MED ORDER — ALPRAZOLAM 0.5 MG PO TABS: 0.5000 mg | ORAL_TABLET | Freq: Every evening | ORAL | 1 refills | Status: DC | PRN

## 2019-06-23 MED ORDER — PROPRANOLOL HCL 10 MG PO TABS: 10.0000 mg | ORAL_TABLET | Freq: Four times a day (QID) | ORAL | 3 refills | Status: AC | PRN

## 2019-06-28 ENCOUNTER — Other Ambulatory Visit: Payer: Medicare Other

## 2019-07-01 ENCOUNTER — Other Ambulatory Visit: Payer: Self-pay | Admitting: Nurse Practitioner

## 2019-07-01 DIAGNOSIS — Z0000 Encounter for general adult medical examination without abnormal findings: Secondary | ICD-10-CM

## 2019-07-01 DIAGNOSIS — Z125 Encounter for screening for malignant neoplasm of prostate: Secondary | ICD-10-CM

## 2019-07-04 ENCOUNTER — Other Ambulatory Visit: Payer: Self-pay | Admitting: Cardiovascular Disease

## 2019-07-07 ENCOUNTER — Other Ambulatory Visit: Payer: Self-pay

## 2019-07-07 ENCOUNTER — Other Ambulatory Visit: Payer: Medicare Other | Admitting: *Deleted

## 2019-07-07 DIAGNOSIS — Z0000 Encounter for general adult medical examination without abnormal findings: Secondary | ICD-10-CM

## 2019-07-07 DIAGNOSIS — I10 Essential (primary) hypertension: Secondary | ICD-10-CM

## 2019-07-07 LAB — BASIC METABOLIC PANEL
BUN/Creatinine Ratio: 20 (ref 10–24)
BUN: 19 mg/dL (ref 8–27)
CO2: 24 mmol/L (ref 20–29)
Calcium: 9.6 mg/dL (ref 8.6–10.2)
Chloride: 103 mmol/L (ref 96–106)
Creatinine, Ser: 0.96 mg/dL (ref 0.76–1.27)
GFR calc Af Amer: 92 mL/min/{1.73_m2} (ref >59)
GFR calc non Af Amer: 80 mL/min/{1.73_m2} (ref >59)
Glucose: 99 mg/dL (ref 65–99)
Potassium: 4 mmol/L (ref 3.5–5.2)
Sodium: 140 mmol/L (ref 134–144)

## 2019-07-07 LAB — PSA: Prostate Specific Ag, Serum: 2.1 ng/mL (ref 0.0–4.0)

## 2019-07-08 ENCOUNTER — Other Ambulatory Visit: Payer: Self-pay | Admitting: *Deleted

## 2019-07-08 ENCOUNTER — Other Ambulatory Visit: Payer: Medicare Other

## 2019-07-08 MED ORDER — VALSARTAN 160 MG PO TABS: 160.0000 mg | ORAL_TABLET | Freq: Every day | ORAL | 3 refills | Status: DC

## 2019-08-04 MED ORDER — VALSARTAN 160 MG PO TABS: 160.0000 mg | ORAL_TABLET | Freq: Every day | ORAL | 0 refills | Status: DC

## 2019-08-11 DIAGNOSIS — R351 Nocturia: Secondary | ICD-10-CM | POA: Diagnosis not present

## 2019-08-12 ENCOUNTER — Other Ambulatory Visit: Payer: Self-pay | Admitting: Cardiovascular Disease

## 2019-08-20 DIAGNOSIS — I10 Essential (primary) hypertension: Secondary | ICD-10-CM | POA: Diagnosis not present

## 2019-08-20 DIAGNOSIS — E785 Hyperlipidemia, unspecified: Secondary | ICD-10-CM | POA: Diagnosis not present

## 2019-08-20 DIAGNOSIS — Z1159 Encounter for screening for other viral diseases: Secondary | ICD-10-CM | POA: Diagnosis not present

## 2019-08-24 ENCOUNTER — Ambulatory Visit: Payer: Medicare Other

## 2019-08-25 DIAGNOSIS — Z23 Encounter for immunization: Secondary | ICD-10-CM | POA: Diagnosis not present

## 2019-08-27 DIAGNOSIS — E785 Hyperlipidemia, unspecified: Secondary | ICD-10-CM | POA: Diagnosis not present

## 2019-08-27 DIAGNOSIS — R7309 Other abnormal glucose: Secondary | ICD-10-CM | POA: Diagnosis not present

## 2019-08-27 DIAGNOSIS — Z1211 Encounter for screening for malignant neoplasm of colon: Secondary | ICD-10-CM | POA: Diagnosis not present

## 2019-08-27 DIAGNOSIS — Z0000 Encounter for general adult medical examination without abnormal findings: Secondary | ICD-10-CM | POA: Diagnosis not present

## 2019-08-27 DIAGNOSIS — I10 Essential (primary) hypertension: Secondary | ICD-10-CM | POA: Diagnosis not present

## 2019-09-02 ENCOUNTER — Ambulatory Visit: Payer: Medicare Other

## 2019-09-03 DIAGNOSIS — M79671 Pain in right foot: Secondary | ICD-10-CM | POA: Diagnosis not present

## 2019-09-13 DIAGNOSIS — Z23 Encounter for immunization: Secondary | ICD-10-CM | POA: Diagnosis not present

## 2019-09-14 ENCOUNTER — Ambulatory Visit: Payer: Medicare Other

## 2019-09-15 DIAGNOSIS — R198 Other specified symptoms and signs involving the digestive system and abdomen: Secondary | ICD-10-CM | POA: Diagnosis not present

## 2019-09-15 DIAGNOSIS — Z86010 Personal history of colonic polyps: Secondary | ICD-10-CM | POA: Diagnosis not present

## 2019-09-23 ENCOUNTER — Telehealth: Payer: Self-pay | Admitting: *Deleted

## 2019-09-23 NOTE — Telephone Encounter (Signed)
Pt states he has an appt scheduled with Dr. Amalia Hailey and the slot says it is for 15 minutes and he is traveling 250 miles to be seen for foreign body in the right foot lateral.

## 2019-09-23 NOTE — Telephone Encounter (Signed)
Left message informing pt that Dr. Amalia Hailey would spend as much time as needed to make sure he was taken care of.

## 2019-09-27 ENCOUNTER — Ambulatory Visit (INDEPENDENT_AMBULATORY_CARE_PROVIDER_SITE_OTHER): Payer: Medicare Other

## 2019-09-27 ENCOUNTER — Ambulatory Visit: Payer: Medicare Other | Admitting: Podiatry

## 2019-09-27 ENCOUNTER — Other Ambulatory Visit: Payer: Self-pay

## 2019-09-27 ENCOUNTER — Encounter: Payer: Self-pay | Admitting: Podiatry

## 2019-09-27 ENCOUNTER — Ambulatory Visit (INDEPENDENT_AMBULATORY_CARE_PROVIDER_SITE_OTHER): Payer: Medicare Other | Admitting: Podiatry

## 2019-09-27 ENCOUNTER — Other Ambulatory Visit: Payer: Self-pay | Admitting: Podiatry

## 2019-09-27 DIAGNOSIS — M778 Other enthesopathies, not elsewhere classified: Secondary | ICD-10-CM | POA: Diagnosis not present

## 2019-09-27 DIAGNOSIS — M779 Enthesopathy, unspecified: Secondary | ICD-10-CM | POA: Diagnosis not present

## 2019-09-27 DIAGNOSIS — M79671 Pain in right foot: Secondary | ICD-10-CM

## 2019-09-27 DIAGNOSIS — I10 Essential (primary) hypertension: Secondary | ICD-10-CM | POA: Diagnosis not present

## 2019-09-29 ENCOUNTER — Ambulatory Visit: Payer: 59 | Admitting: Podiatry

## 2019-10-04 NOTE — Progress Notes (Signed)
Subjective:   Patient ID: Gregory Hill, male   DOB: 69 y.o.   MRN: [id number]   HPI Patient presents with pain in the foot along with inflammation stating that it feels like walking on a rock and walking on fluid on the outside of the right foot.  Patient states been going on for several months patient does not smoke likes to be active   Review of Systems  All other systems reviewed and are negative.       Objective:  Physical Exam Vitals and nursing note reviewed.  Constitutional:      Appearance: He is well-developed.  Pulmonary:     Effort: Pulmonary effort is normal.  Musculoskeletal:        General: Normal range of motion.  Skin:    General: Skin is warm.  Neurological:     Mental Status: He is alert.     Neurovascular status intact muscle strength found to be adequate range of motion within normal limits with inflammation pain of the fifth MPJ right fluid buildup around the joint surface with distention of the capsule     Assessment:  Acute capsulitis fifth MPJ right     Plan:  Agent he reviewed condition sterile prep done injected the capsule 3 mg Dexasone Kenalog 51M Xylocaine advised on supportive shoes rigid bottom in nature and reappoint to recheck  X-rays were negative for signs of fracture neck negative for signs of big bony injury

## 2019-10-09 ENCOUNTER — Encounter: Payer: Self-pay | Admitting: Podiatry

## 2019-11-24 DIAGNOSIS — I10 Essential (primary) hypertension: Secondary | ICD-10-CM | POA: Diagnosis not present

## 2019-11-24 DIAGNOSIS — R7309 Other abnormal glucose: Secondary | ICD-10-CM | POA: Diagnosis not present

## 2019-12-01 DIAGNOSIS — R002 Palpitations: Secondary | ICD-10-CM | POA: Diagnosis not present

## 2019-12-01 DIAGNOSIS — E785 Hyperlipidemia, unspecified: Secondary | ICD-10-CM | POA: Diagnosis not present

## 2019-12-01 DIAGNOSIS — I493 Ventricular premature depolarization: Secondary | ICD-10-CM | POA: Diagnosis not present

## 2019-12-02 DIAGNOSIS — I10 Essential (primary) hypertension: Secondary | ICD-10-CM | POA: Diagnosis not present

## 2019-12-02 DIAGNOSIS — I493 Ventricular premature depolarization: Secondary | ICD-10-CM | POA: Diagnosis not present

## 2019-12-02 DIAGNOSIS — E785 Hyperlipidemia, unspecified: Secondary | ICD-10-CM | POA: Diagnosis not present

## 2020-01-03 DIAGNOSIS — L570 Actinic keratosis: Secondary | ICD-10-CM | POA: Diagnosis not present

## 2020-02-15 DIAGNOSIS — L738 Other specified follicular disorders: Secondary | ICD-10-CM | POA: Diagnosis not present

## 2020-02-19 ENCOUNTER — Other Ambulatory Visit: Payer: Self-pay

## 2020-02-19 ENCOUNTER — Emergency Department (HOSPITAL_COMMUNITY)
Admission: EM | Admit: 2020-02-19 | Discharge: 2020-02-19 | Disposition: A | Discharge status: Home / Self Care | Payer: Medicare Other | Attending: Emergency Medicine | Admitting: Emergency Medicine

## 2020-02-19 ENCOUNTER — Encounter (HOSPITAL_COMMUNITY): Payer: Self-pay | Admitting: Physician Assistant

## 2020-02-19 ENCOUNTER — Emergency Department (HOSPITAL_COMMUNITY): Payer: Medicare Other

## 2020-02-19 DIAGNOSIS — I10 Essential (primary) hypertension: Secondary | ICD-10-CM | POA: Diagnosis not present

## 2020-02-19 DIAGNOSIS — M5134 Other intervertebral disc degeneration, thoracic region: Secondary | ICD-10-CM | POA: Diagnosis not present

## 2020-02-19 DIAGNOSIS — Z7982 Long term (current) use of aspirin: Secondary | ICD-10-CM | POA: Diagnosis not present

## 2020-02-19 DIAGNOSIS — I4819 Other persistent atrial fibrillation: Secondary | ICD-10-CM | POA: Diagnosis not present

## 2020-02-19 DIAGNOSIS — Z79899 Other long term (current) drug therapy: Secondary | ICD-10-CM | POA: Diagnosis not present

## 2020-02-19 DIAGNOSIS — R002 Palpitations: Secondary | ICD-10-CM | POA: Diagnosis present

## 2020-02-19 DIAGNOSIS — I4891 Unspecified atrial fibrillation: Secondary | ICD-10-CM | POA: Insufficient documentation

## 2020-02-19 LAB — CBC
HCT: 41.6 % (ref 39.0–52.0)
Hemoglobin: 13.5 g/dL (ref 13.0–17.0)
MCH: 31 pg (ref 26.0–34.0)
MCHC: 32.5 g/dL (ref 30.0–36.0)
MCV: 95.4 fL (ref 80.0–100.0)
Platelets: 205 K/uL (ref 150–400)
RBC: 4.36 MIL/uL (ref 4.22–5.81)
RDW: 14.1 % (ref 11.5–15.5)
WBC: 5.7 K/uL (ref 4.0–10.5)
nRBC: 0 % (ref 0.0–0.2)

## 2020-02-19 LAB — BASIC METABOLIC PANEL WITH GFR
Anion gap: 8 (ref 5–15)
BUN: 15 mg/dL (ref 8–23)
CO2: 27 mmol/L (ref 22–32)
Calcium: 9.3 mg/dL (ref 8.9–10.3)
Chloride: 105 mmol/L (ref 98–111)
Creatinine, Ser: 1.01 mg/dL (ref 0.61–1.24)
GFR calc Af Amer: >60 mL/min
GFR calc non Af Amer: >60 mL/min
Glucose, Bld: 162 mg/dL — ABNORMAL HIGH (ref 70–99)
Potassium: 3.7 mmol/L (ref 3.5–5.1)
Sodium: 140 mmol/L (ref 135–145)

## 2020-02-19 MED ORDER — APIXABAN 5 MG PO TABS
5.0000 mg | ORAL_TABLET | Freq: Once | ORAL | Status: DC
  Filled 2020-02-19: qty 1

## 2020-02-19 MED ORDER — METOPROLOL SUCCINATE ER 25 MG PO TB24
25.0000 mg | ORAL_TABLET | Freq: Every day | ORAL | 6 refills | Status: DC
Start: 2020-02-19 — End: 2020-03-01

## 2020-02-19 MED ORDER — APIXABAN 5 MG PO TABS
5.0000 mg | ORAL_TABLET | Freq: Two times a day (BID) | ORAL | 6 refills | Status: DC
Start: 2020-02-19 — End: 2020-02-19

## 2020-02-19 MED ORDER — APIXABAN 5 MG PO TABS
5.0000 mg | ORAL_TABLET | Freq: Two times a day (BID) | ORAL | 6 refills | Status: DC
Start: 2020-02-19 — End: 2020-03-01

## 2020-02-19 MED ORDER — METOPROLOL SUCCINATE ER 25 MG PO TB24
25.0000 mg | ORAL_TABLET | Freq: Every day | ORAL | 6 refills | Status: DC
Start: 2020-02-19 — End: 2020-02-19

## 2020-02-19 MED ORDER — METOPROLOL TARTRATE 25 MG PO TABS
12.5000 mg | ORAL_TABLET | Freq: Once | ORAL | Status: AC
  Administered 2020-02-19: 12.5 mg via ORAL
  Filled 2020-02-19: qty 1

## 2020-02-19 MED ORDER — APIXABAN 5 MG PO TABS
5.0000 mg | ORAL_TABLET | Freq: Two times a day (BID) | ORAL | 0 refills | Status: DC
Start: 2020-02-19 — End: 2020-02-19

## 2020-02-19 NOTE — Discharge Instructions (Addendum)
Please return for any problem.  Follow-up with your regular care provider as instructed.  Follow-up with cardiology as instructed.   Information on my medicine - ELIQUIS (apixaban)  Why was Eliquis prescribed for you? Eliquis was prescribed for you to reduce the risk of forming blood clots that can cause a stroke if you have a medical condition called atrial fibrillation (a type of irregular heartbeat) OR to reduce the risk of a blood clots forming after orthopedic surgery.  What do You need to know about Eliquis ? Take your Eliquis TWICE DAILY - one tablet in the morning and one tablet in the evening with or without food.  It would be best to take the doses about the same time each day.  If you have difficulty swallowing the tablet whole please discuss with your pharmacist how to take the medication safely.  Take Eliquis exactly as prescribed by your doctor and DO NOT stop taking Eliquis without talking to the doctor who prescribed the medication.  Stopping may increase your risk of developing a new clot or stroke.  Refill your prescription before you run out.  After discharge, you should have regular check-up appointments with your healthcare provider that is prescribing your Eliquis.  In the future your dose may need to be changed if your kidney function or weight changes by a significant amount or as you get older.  What do you do if you miss a dose? If you miss a dose, take it as soon as you remember on the same day and resume taking twice daily.  Do not take more than one dose of ELIQUIS at the same time.  Important Safety Information A possible side effect of Eliquis is bleeding. You should call your healthcare provider right away if you experience any of the following: ? Bleeding from an injury or your nose that does not stop. ? Unusual colored urine (red or dark brown) or unusual colored stools (red or black). ? Unusual bruising for unknown reasons. ? A serious fall or if  you hit your head (even if there is no bleeding).  Some medicines may interact with Eliquis and might increase your risk of bleeding or clotting while on Eliquis. To help avoid this, consult your healthcare provider or pharmacist prior to using any new prescription or non-prescription medications, including herbals, vitamins, non-steroidal anti-inflammatory drugs (NSAIDs) and supplements.  This website has more information on Eliquis (apixaban): [email or web address].no.

## 2020-02-19 NOTE — Consult Note (Addendum)
Cardiology Consultation:   Patient ID: Gregory Hill; [id number]; 18-Mar-1951   Admit date: 02/19/2020 Date of Consult: 02/19/2020  Primary Care Provider: Prince Solian, MD Primary Cardiologist: Mertie Moores, MD 03/31/2019 Dr. Margarita Mail, [place], [facility] Primary Electrophysiologist:  None   Patient Profile:   Gregory Hill is a 69 y.o. male with a hx of HTN, HLD, PVCs, PAF, who is being seen today for the evaluation of Afib at the request of Dr Francia Greaves.  History of Present Illness:   [name] chronically is frequent PVCs.  He can feel his heart skip, but he does not otherwise get any symptoms from it.  When it happens very very often, he will take a propranolol and that will help.  He has been very busy recently, getting ready to move.  He has also been under some stress from the move.  He has to be out of his house by Monday night.  He feels the PVCs, but has not had them more than usual recently.  Last night, he started feeling bad and thought he had gone back into atrial fibrillation.  He was a little lightheaded, but did not have chest pain or shortness of breath.  He was a little nauseated and had some loose stools, but no other symptoms.  He took propanolol twice during the night, it helped a little.  This a.m., the symptoms were still there.  He contacted Dr. Acie Fredrickson who recommended he go to the ER.  In the emergency room, his heart rate is well controlled.  He has had some pauses greater than 2 seconds, but none greater than 2.5 seconds.  He has only had one previous episode of atrial fibrillation that he knows about, in 2008. That time, they were giving him heart rate lowering medication, and were starting an IV in the back of his hand.  He states his heart stopped and he required CPR.  He also remembers getting cardioverted that admission.  This is the first time that he has had anything he thought was atrial fibrillation since that admission.  Except  for the increased stress related to moving, he has been in his usual state of health.   Past Medical History:  Diagnosis Date  . Anxiety   . Atrial fibrillation ([place])   . Hyperlipidemia   . Hypertension   . PVC (premature ventricular contraction)     Past Surgical History:  Procedure Laterality Date  . none       Prior to Admission medications   Medication Sig Start Date End Date Taking? Authorizing Provider  ALPRAZolam Duanne Moron) 0.5 MG tablet Take 1 tablet (0.5 mg total) by mouth at bedtime as needed for anxiety. 06/23/19   Nahser, Wonda Cheng, MD  aspirin 81 MG tablet Take 81 mg by mouth daily.      [provider]  cholecalciferol (VITAMIN D) 1000 UNITS tablet Take 1,000 Units by mouth daily.     [provider]  fenofibrate (TRICOR) 145 MG tablet Take 1 tablet (145 mg total) by mouth daily. 03/31/19   Nahser, Wonda Cheng, MD  metoprolol tartrate (LOPRESSOR) 25 MG tablet Take 0.5 tablets (12.5 mg total) by mouth 2 (two) times daily. 03/31/19   Nahser, Wonda Cheng, MD  Multiple Vitamin (MULTIVITAMIN) tablet Take 1 tablet by mouth daily.      [provider]  niacin (NIASPAN) 1000 MG CR tablet Take 1 tablet (1,000 mg total) by mouth at bedtime. 03/31/19   Nahser, Wonda Cheng, MD  omega-3 acid ethyl esters (LOVAZA) 1 g capsule Take 3 capsules (3 g total) by mouth daily. 09/22/18   Nahser, Wonda Cheng, MD  propranolol (INDERAL) 10 MG tablet Take 1 tablet (10 mg total) by mouth 4 (four) times daily as needed (RAPID HEARTRATE). 06/23/19   Nahser, Wonda Cheng, MD  rosuvastatin (CRESTOR) 10 MG tablet TAKE 1 TABLET DAILY 07/06/19   Nahser, Wonda Cheng, MD  valACYclovir (VALTREX) 1000 MG tablet Take 1 g by mouth 2 (two) times daily as needed. AS NEEDED FOR BREAKOUTS 03/12/16   [provider]  valsartan (DIOVAN) 160 MG tablet Take 1 tablet (160 mg total) by mouth daily. 08/04/19   Nahser, Wonda Cheng, MD    Inpatient Medications: Scheduled Meds: . metoprolol tartrate  12.5 mg Oral Once    Continuous Infusions:  PRN Meds:   Allergies:    Allergies  Allergen Reactions  . Azithromycin     As child   . Other   . Bystolic [Nebivolol Hcl]     Social History:   Social History   Socioeconomic History  . Marital status: Married    Spouse name: Not on file  . Number of children: Not on file  . Years of education: Not on file  . Highest education level: Not on file  Occupational History  . Not on file  Tobacco Use  . Smoking status: Never Smoker  . Smokeless tobacco: Never Used  Substance and Sexual Activity  . Alcohol use: Yes    Alcohol/week: 3.0 - 5.0 standard drinks    Types: 3 - 5 drink(s) per week  . Drug use: No  . Sexual activity: Yes  Other Topics Concern  . Not on file  Social History Narrative  . Not on file   Social Determinants of Health   Financial Resource Strain:   . Difficulty of Paying Living Expenses:   Food Insecurity:   . Worried About Charity fundraiser in the Last Year:   . Arboriculturist in the Last Year:   Transportation Needs:   . Film/video editor (Medical):   Marland Kitchen Lack of Transportation (Non-Medical):   Physical Activity:   . Days of Exercise per Week:   . Minutes of Exercise per Session:   Stress:   . Feeling of Stress :   Social Connections:   . Frequency of Communication with Friends and Family:   . Frequency of Social Gatherings with Friends and Family:   . Attends Religious Services:   . Active Member of Clubs or Organizations:   . Attends Archivist Meetings:   Marland Kitchen Marital Status:   Intimate Partner Violence:   . Fear of Current or Ex-Partner:   . Emotionally Abused:   Marland Kitchen Physically Abused:   . Sexually Abused:     Family History:   Family History  Problem Relation Age of Onset  . Heart disease Mother   . Heart attack Mother    Family Status:  Family Status  Relation Name Status  . Mother  Alive  . Father  Deceased at age 64       emphysema  . Sister  Alive  . Brother  Alive  . MGM   Deceased  . MGF  Deceased  . PGM  Deceased  . PGF  Deceased    ROS:  Please see the history of present illness.  All other ROS reviewed and negative.     Physical Exam/Data:   Vitals:   02/19/20 1033  02/19/20 1123  BP: (!) 134/90 127/74  Pulse: 74 69  Resp: 16 14  Temp: 99 F (37.2 C)   TempSrc: Oral   SpO2: 100%   Weight: 83.9 kg   Height: 6\' 2"  (1.88 m)    No intake or output data in the 24 hours ending 02/19/20 1216  Last 3 Weights 02/19/2020 03/31/2019 09/22/2018  Weight (lbs) 185 lb 184 lb 1.9 oz 188 lb 12.8 oz  Weight (kg) 83.915 kg 83.516 kg 85.639 kg     Body mass index is 23.75 kg/m.   General:  Well nourished, well developed, male in no acute distress HEENT: normal Lymph: no adenopathy Neck: JVD -not elevated Endocrine:  No thryomegaly Vascular: No carotid bruits; 4/4 extremity pulses 2+  Cardiac:  normal S1, S2; irregular rate and rhythm; no murmur Lungs: Few dry rales bilaterally, no wheezing, rhonchi Abd: soft, nontender, no hepatomegaly  Ext: no edema Musculoskeletal:  No deformities, BUE and BLE strength normal and equal Skin: warm and dry  Neuro:  CNs 2-12 intact, no focal abnormalities noted Psych:  Normal affect   EKG:  The EKG was personally reviewed and demonstrates: Coarse atrial fibrillation, heart rate 72, no acute ischemic changes Telemetry:  Telemetry was personally reviewed and demonstrates: Atrial fibrillation, heart rate well controlled, no pauses greater than 2.5 seconds   CV studies:   ECHO: 12/28/2015 - Left ventricle: Global LV longitudinal strain is -20.4% The  cavity size was normal. Systolic function was normal. The  estimated ejection fraction was in the range of 55% to 60%. Wall  motion was normal; there were no regional wall motion  abnormalities.  - Aortic valve: Trileaflet; mildly thickened, mildly calcified  leaflets. There was mild regurgitation.   MYOVIEW: 07/11/2015   Nuclear stress EF: 65%.  There  was no ST segment deviation noted during stress.  The study is normal.  This is a low risk study.  The left ventricular ejection fraction is normal (55-65%).    Laboratory Data:   Chemistry Recent Labs  Lab 02/19/20 1042  NA 140  K 3.7  CL 105  CO2 27  GLUCOSE 162*  BUN 15  CREATININE 1.01  CALCIUM 9.3  GFRNONAA >60  GFRAA >60  ANIONGAP 8    Lab Results  Component Value Date   ALT 21 03/31/2019   AST 29 03/31/2019   ALKPHOS 33 (L) 03/31/2019   BILITOT 0.9 03/31/2019   Hematology Recent Labs  Lab 02/19/20 1042  WBC 5.7  RBC 4.36  HGB 13.5  HCT 41.6  MCV 95.4  MCH 31.0  MCHC 32.5  RDW 14.1  PLT 205   Cardiac Enzymes High Sensitivity Troponin:  No results for input(s): TROPONINIHS in the last 720 hours.    BNPNo results for input(s): BNP, PROBNP in the last 168 hours.  DDimer No results for input(s): DDIMER in the last 168 hours. TSH:  Lab Results  Component Value Date   TSH 3.480 03/31/2019   Lipids: Lab Results  Component Value Date   CHOL 108 03/31/2019   HDL 29 (L) 03/31/2019   LDLCALC 64 03/31/2019   TRIG 73 03/31/2019   CHOLHDL 3.7 03/31/2019   HgbA1c:No results found for: HGBA1C Magnesium:  Magnesium  Date Value Ref Range Status  01/30/2007 2.0  Final     Radiology/Studies:  DG Chest 2 View  Result Date: 02/19/2020 CLINICAL DATA:  AFib EXAM: CHEST - 2 VIEW COMPARISON:  None. FINDINGS: The heart size and mediastinal contours are within normal  limits. Both lungs are clear. Disc degenerative disease of the thoracic spine. IMPRESSION: No acute abnormality of the lungs. Electronically Signed   By: Eddie Candle M.D.   On: 02/19/2020 11:24    Assessment and Plan:   1.  Persistent atrial fibrillation -To his knowledge, this is only the second episode he has had in his life -However, he has palpitations all the time because of his PVCs. -I discussed options with the patient including cardioversion and discharge today if he can reliably  state that he has been in atrial fibrillation less than 48 hours.  Because of his palpitations, he is not sure he can do this -Because his heart rate is controlled, he could be put on a DOAC and a standing dose of beta-blocker, then follow-up with Dr. Maudie Mercury in [place] -The third option is put him on a DOAC and do a TEE cardioversion on Monday -He will discuss the situation with his wife and advise MD -His heart rate is controlled, but he had 2 doses of propanolol overnight.  If he is discharged in atrial fibrillation, would need to be on some standing dose of beta-blocker.  Principal Problem:   Atrial fibrillation, persistent ([place])     For questions or updates, please contact [place] Please consult [email or web address] for contact info under Cardiology/STEMI.   Signed, Rosaria Ferries, PA-C  02/19/2020 12:16 PM  Patient seen and examined with Rosaria Ferries PA-C.  Agree as above, with the following exceptions and changes as noted below. 69 yo male with PAF, with recurrence and symptoms since yesterday evening.. Gen: NAD, CV: iRRR, no murmurs, Lungs: clear, Abd: soft, Extrem: Warm, well perfused, no edema, Neuro/Psych: alert and oriented x 3, normal mood and affect. All available labs, radiology testing, previous records reviewed.   Discussed plan in detail. Discussed atrial fibrillation natural history, provoking factors, management and treatment options. Discussed DCCV, TEE/DCCV in detail. We discussed rate, rhythm control as well as indications for AC. With HTN and age, he has a chads2vasc score of at least 2, warranting anticoagulation. I discussed the risks, benefits, and alternatives to oral anticoagulation for the prevention of stroke in atrial fibrillation/atrial flutter.  In the setting of the patient's independent risk factors, we discussed risk of stroke per year weighed against the risks of bleeding.  We participated in shared decision making regarding the initiation of  anticoagulation, and I answered all questions to the best my ability.  We have determined that he may be well served by TEE/DCCV after at least 3 doses of anticoagulation. Logistics are quite challenging given his need to move out of his house on Monday 7/26. We will therefore plan for a TEE/DCCV after that date. He would like to arrange this on my schedule, and my earliest availability is August 4. This is a reasonable time frame, and we have agreed to proceed on this date. He will need to see if he is in sinus rhythm before that time, in case we do not need to complete TEE/DCCV. All questions answered.   This patients CHA2DS2-VASc Score and unadjusted Ischemic Stroke Rate (% per year) is equal to 2.2 % stroke rate/year from a score of 2 Above score calculated as 1 point each if present [CHF, HTN, DM, Vascular=MI/PAD/Aortic Plaque, Age if 65-74, or Male] Above score calculated as 2 points each if present [Age > 75, or Stroke/TIA/TE]  Elouise Munroe, MD

## 2020-02-19 NOTE — H&P (View-Only) (Signed)
Cardiology Consultation:   Patient ID: [facility]; [id number]; 02/07/1951   Admit date: 02/19/2020 Date of Consult: 02/19/2020  Primary Care Provider: Prince Solian, MD Primary Cardiologist: Mertie Moores, MD 03/31/2019 Dr. Margarita Mail, Bernie, [facility] Primary Electrophysiologist:  None   Patient Profile:   [facility] is a 69 y.o. male with a hx of HTN, HLD, PVCs, PAF, who is being seen today for the evaluation of Afib at the request of Dr Francia Greaves.  History of Present Illness:   Mr. Blackerby chronically is frequent PVCs.  He can feel his heart skip, but he does not otherwise get any symptoms from it.  When it happens very very often, he will take a propranolol and that will help.  He has been very busy recently, getting ready to move.  He has also been under some stress from the move.  He has to be out of his house by Monday night.  He feels the PVCs, but has not had them more than usual recently.  Last night, he started feeling bad and thought he had gone back into atrial fibrillation.  He was a little lightheaded, but did not have chest pain or shortness of breath.  He was a little nauseated and had some loose stools, but no other symptoms.  He took propanolol twice during the night, it helped a little.  This a.m., the symptoms were still there.  He contacted Dr. Acie Fredrickson who recommended he go to the ER.  In the emergency room, his heart rate is well controlled.  He has had some pauses greater than 2 seconds, but none greater than 2.5 seconds.  He has only had one previous episode of atrial fibrillation that he knows about, in 2008. That time, they were giving him heart rate lowering medication, and were starting an IV in the back of his hand.  He states his heart stopped and he required CPR.  He also remembers getting cardioverted that admission.  This is the first time that he has had anything he thought was atrial fibrillation since that admission.  Except  for the increased stress related to moving, he has been in his usual state of health.   Past Medical History:  Diagnosis Date  . Anxiety   . Atrial fibrillation ([place])   . Hyperlipidemia   . Hypertension   . PVC (premature ventricular contraction)     Past Surgical History:  Procedure Laterality Date  . none       Prior to Admission medications   Medication Sig Start Date End Date Taking? Authorizing Provider  ALPRAZolam Duanne Moron) 0.5 MG tablet Take 1 tablet (0.5 mg total) by mouth at bedtime as needed for anxiety. 06/23/19   Nahser, Wonda Cheng, MD  aspirin 81 MG tablet Take 81 mg by mouth daily.      [provider]  cholecalciferol (VITAMIN D) 1000 UNITS tablet Take 1,000 Units by mouth daily.     [provider]  fenofibrate (TRICOR) 145 MG tablet Take 1 tablet (145 mg total) by mouth daily. 03/31/19   Nahser, Wonda Cheng, MD  metoprolol tartrate (LOPRESSOR) 25 MG tablet Take 0.5 tablets (12.5 mg total) by mouth 2 (two) times daily. 03/31/19   Nahser, Wonda Cheng, MD  Multiple Vitamin (MULTIVITAMIN) tablet Take 1 tablet by mouth daily.      [provider]  niacin (NIASPAN) 1000 MG CR tablet Take 1 tablet (1,000 mg total) by mouth at bedtime. 03/31/19   Nahser, Wonda Cheng, MD  omega-3 acid ethyl esters (LOVAZA) 1 g capsule Take 3 capsules (3 g total) by mouth daily. 09/22/18   Nahser, Wonda Cheng, MD  propranolol (INDERAL) 10 MG tablet Take 1 tablet (10 mg total) by mouth 4 (four) times daily as needed (RAPID HEARTRATE). 06/23/19   Nahser, Wonda Cheng, MD  rosuvastatin (CRESTOR) 10 MG tablet TAKE 1 TABLET DAILY 07/06/19   Nahser, Wonda Cheng, MD  valACYclovir (VALTREX) 1000 MG tablet Take 1 g by mouth 2 (two) times daily as needed. AS NEEDED FOR BREAKOUTS 03/12/16   [provider]  valsartan (DIOVAN) 160 MG tablet Take 1 tablet (160 mg total) by mouth daily. 08/04/19   Nahser, Wonda Cheng, MD    Inpatient Medications: Scheduled Meds: . metoprolol tartrate  12.5 mg Oral Once    Continuous Infusions:  PRN Meds:   Allergies:    Allergies  Allergen Reactions  . Azithromycin     As child   . Other   . Bystolic [Nebivolol Hcl]     Social History:   Social History   Socioeconomic History  . Marital status: Married    Spouse name: Not on file  . Number of children: Not on file  . Years of education: Not on file  . Highest education level: Not on file  Occupational History  . Not on file  Tobacco Use  . Smoking status: Never Smoker  . Smokeless tobacco: Never Used  Substance and Sexual Activity  . Alcohol use: Yes    Alcohol/week: 3.0 - 5.0 standard drinks    Types: 3 - 5 drink(s) per week  . Drug use: No  . Sexual activity: Yes  Other Topics Concern  . Not on file  Social History Narrative  . Not on file   Social Determinants of Health   Financial Resource Strain:   . Difficulty of Paying Living Expenses:   Food Insecurity:   . Worried About Charity fundraiser in the Last Year:   . Arboriculturist in the Last Year:   Transportation Needs:   . Film/video editor (Medical):   Marland Kitchen Lack of Transportation (Non-Medical):   Physical Activity:   . Days of Exercise per Week:   . Minutes of Exercise per Session:   Stress:   . Feeling of Stress :   Social Connections:   . Frequency of Communication with Friends and Family:   . Frequency of Social Gatherings with Friends and Family:   . Attends Religious Services:   . Active Member of Clubs or Organizations:   . Attends Archivist Meetings:   Marland Kitchen Marital Status:   Intimate Partner Violence:   . Fear of Current or Ex-Partner:   . Emotionally Abused:   Marland Kitchen Physically Abused:   . Sexually Abused:     Family History:   Family History  Problem Relation Age of Onset  . Heart disease Mother   . Heart attack Mother    Family Status:  Family Status  Relation Name Status  . Mother  Alive  . Father  Deceased at age 40       emphysema  . Sister  Alive  . Brother  Alive  . MGM   Deceased  . MGF  Deceased  . PGM  Deceased  . PGF  Deceased    ROS:  Please see the history of present illness.  All other ROS reviewed and negative.     Physical Exam/Data:   Vitals:   02/19/20 1033  02/19/20 1123  BP: (!) 134/90 127/74  Pulse: 74 69  Resp: 16 14  Temp: 99 F (37.2 C)   TempSrc: Oral   SpO2: 100%   Weight: 83.9 kg   Height: 6\' 2"  (1.88 m)    No intake or output data in the 24 hours ending 02/19/20 1216  Last 3 Weights 02/19/2020 03/31/2019 09/22/2018  Weight (lbs) 185 lb 184 lb 1.9 oz 188 lb 12.8 oz  Weight (kg) 83.915 kg 83.516 kg 85.639 kg     Body mass index is 23.75 kg/m.   General:  Well nourished, well developed, male in no acute distress HEENT: normal Lymph: no adenopathy Neck: JVD -not elevated Endocrine:  No thryomegaly Vascular: No carotid bruits; 4/4 extremity pulses 2+  Cardiac:  normal S1, S2; irregular rate and rhythm; no murmur Lungs: Few dry rales bilaterally, no wheezing, rhonchi Abd: soft, nontender, no hepatomegaly  Ext: no edema Musculoskeletal:  No deformities, BUE and BLE strength normal and equal Skin: warm and dry  Neuro:  CNs 2-12 intact, no focal abnormalities noted Psych:  Normal affect   EKG:  The EKG was personally reviewed and demonstrates: Coarse atrial fibrillation, heart rate 72, no acute ischemic changes Telemetry:  Telemetry was personally reviewed and demonstrates: Atrial fibrillation, heart rate well controlled, no pauses greater than 2.5 seconds   CV studies:   ECHO: 12/28/2015 - Left ventricle: Global LV longitudinal strain is -20.4% The  cavity size was normal. Systolic function was normal. The  estimated ejection fraction was in the range of 55% to 60%. Wall  motion was normal; there were no regional wall motion  abnormalities.  - Aortic valve: Trileaflet; mildly thickened, mildly calcified  leaflets. There was mild regurgitation.   MYOVIEW: 07/11/2015   Nuclear stress EF: 65%.  There  was no ST segment deviation noted during stress.  The study is normal.  This is a low risk study.  The left ventricular ejection fraction is normal (55-65%).    Laboratory Data:   Chemistry Recent Labs  Lab 02/19/20 1042  NA 140  K 3.7  CL 105  CO2 27  GLUCOSE 162*  BUN 15  CREATININE 1.01  CALCIUM 9.3  GFRNONAA >60  GFRAA >60  ANIONGAP 8    Lab Results  Component Value Date   ALT 21 03/31/2019   AST 29 03/31/2019   ALKPHOS 33 (L) 03/31/2019   BILITOT 0.9 03/31/2019   Hematology Recent Labs  Lab 02/19/20 1042  WBC 5.7  RBC 4.36  HGB 13.5  HCT 41.6  MCV 95.4  MCH 31.0  MCHC 32.5  RDW 14.1  PLT 205   Cardiac Enzymes High Sensitivity Troponin:  No results for input(s): TROPONINIHS in the last 720 hours.    BNPNo results for input(s): BNP, PROBNP in the last 168 hours.  DDimer No results for input(s): DDIMER in the last 168 hours. TSH:  Lab Results  Component Value Date   TSH 3.480 03/31/2019   Lipids: Lab Results  Component Value Date   CHOL 108 03/31/2019   HDL 29 (L) 03/31/2019   LDLCALC 64 03/31/2019   TRIG 73 03/31/2019   CHOLHDL 3.7 03/31/2019   HgbA1c:No results found for: HGBA1C Magnesium:  Magnesium  Date Value Ref Range Status  01/30/2007 2.0  Final     Radiology/Studies:  DG Chest 2 View  Result Date: 02/19/2020 CLINICAL DATA:  AFib EXAM: CHEST - 2 VIEW COMPARISON:  None. FINDINGS: The heart size and mediastinal contours are within normal  limits. Both lungs are clear. Disc degenerative disease of the thoracic spine. IMPRESSION: No acute abnormality of the lungs. Electronically Signed   By: Eddie Candle M.D.   On: 02/19/2020 11:24    Assessment and Plan:   1.  Persistent atrial fibrillation -To his knowledge, this is only the second episode he has had in his life -However, he has palpitations all the time because of his PVCs. -I discussed options with the patient including cardioversion and discharge today if he can reliably  state that he has been in atrial fibrillation less than 48 hours.  Because of his palpitations, he is not sure he can do this -Because his heart rate is controlled, he could be put on a DOAC and a standing dose of beta-blocker, then follow-up with Dr. Maudie Mercury in [place] -The third option is put him on a DOAC and do a TEE cardioversion on Monday -He will discuss the situation with his wife and advise MD -His heart rate is controlled, but he had 2 doses of propanolol overnight.  If he is discharged in atrial fibrillation, would need to be on some standing dose of beta-blocker.  Principal Problem:   Atrial fibrillation, persistent ([place])     For questions or updates, please contact [place] Please consult [email or web address] for contact info under Cardiology/STEMI.   Signed, Rosaria Ferries, PA-C  02/19/2020 12:16 PM  Patient seen and examined with Rosaria Ferries PA-C.  Agree as above, with the following exceptions and changes as noted below. 69 yo male with PAF, with recurrence and symptoms since yesterday evening.. Gen: NAD, CV: iRRR, no murmurs, Lungs: clear, Abd: soft, Extrem: Warm, well perfused, no edema, Neuro/Psych: alert and oriented x 3, normal mood and affect. All available labs, radiology testing, previous records reviewed.   Discussed plan in detail. Discussed atrial fibrillation natural history, provoking factors, management and treatment options. Discussed DCCV, TEE/DCCV in detail. We discussed rate, rhythm control as well as indications for AC. With HTN and age, he has a chads2vasc score of at least 2, warranting anticoagulation. I discussed the risks, benefits, and alternatives to oral anticoagulation for the prevention of stroke in atrial fibrillation/atrial flutter.  In the setting of the patient's independent risk factors, we discussed risk of stroke per year weighed against the risks of bleeding.  We participated in shared decision making regarding the initiation of  anticoagulation, and I answered all questions to the best my ability.  We have determined that he may be well served by TEE/DCCV after at least 3 doses of anticoagulation. Logistics are quite challenging given his need to move out of his house on Monday 7/26. We will therefore plan for a TEE/DCCV after that date. He would like to arrange this on my schedule, and my earliest availability is August 4. This is a reasonable time frame, and we have agreed to proceed on this date. He will need to see if he is in sinus rhythm before that time, in case we do not need to complete TEE/DCCV. All questions answered.   This patients CHA2DS2-VASc Score and unadjusted Ischemic Stroke Rate (% per year) is equal to 2.2 % stroke rate/year from a score of 2 Above score calculated as 1 point each if present [CHF, HTN, DM, Vascular=MI/PAD/Aortic Plaque, Age if 65-74, or Male] Above score calculated as 2 points each if present [Age > 75, or Stroke/TIA/TE]  Elouise Munroe, MD

## 2020-02-19 NOTE — ED Triage Notes (Signed)
Pt woke up at 0230 with erratic heart rate. Took one propranolol at that time, another one at 0300 with some improvement. Cardiologist advised him to come here for eval of afib. Also endorses upset stomach with soft stool and lightheadedness since 0430.

## 2020-02-19 NOTE — ED Provider Notes (Signed)
[facility] DEPARTMENT Provider Note   CSN: [id number] Arrival date & time: 02/19/20  1025     History No chief complaint on file.   Greg[facility] Hill is a 69 y.o. male.  69 year old male with prior medical history detailed below presents for evaluation of palpitations.  Patient with longstanding history of paroxysmal A. fib.  Patient not currently on anticoagulation outside of daily aspirin.  Patient reports that he has a plan to move to [place] on Monday.  He has spent the last several days packing of his current residence for the impending move.  He reports intermittent sensations of fluttering.  This morning he reports more significant erratic heartbeat since approximately 2:30 AM.  He has taken 3 doses of 10 mg propanolol.  He has not yet taken his morning dose of metoprolol.  He reports persistent irregular heartbeat over the course of the morning.  He denies associated chest pain or shortness of breath.  Denies syncopal or presyncopal symptoms.  He contacted his regular cardiology service and was instructed to come to the ED for evaluation.  The history is provided by the patient.  Palpitations Palpitations quality:  Irregular Onset quality:  Gradual Timing:  Intermittent Progression:  Waxing and waning Chronicity:  New Relieved by:  Nothing Worsened by:  Nothing Associated symptoms: no back pain and no chest pain        Past Medical History:  Diagnosis Date  . Anxiety   . Atrial fibrillation ([place])   . Hyperlipidemia   . Hypertension   . PVC (premature ventricular contraction)     Patient Active Problem List   Diagnosis Date Noted  . Shoulder pain 06/19/2015  . Back pain with left-sided radiculopathy 06/17/2013  . HTN (hypertension) 09/03/2010  . A-fib ([place]) 09/03/2010  . PVC (premature ventricular contraction) 09/03/2010  . Hyperlipidemia 09/03/2010  . Anxiety 09/03/2010    Past Surgical History:  Procedure Laterality  Date  . none         Family History  Problem Relation Age of Onset  . Heart disease Mother   . Heart attack Mother     Social History   Tobacco Use  . Smoking status: Never Smoker  . Smokeless tobacco: Never Used  Substance Use Topics  . Alcohol use: Yes    Alcohol/week: 3.0 - 5.0 standard drinks    Types: 3 - 5 drink(s) per week  . Drug use: No    Home Medications Prior to Admission medications   Medication Sig Start Date End Date Taking? Authorizing Provider  ALPRAZolam Duanne Moron) 0.5 MG tablet Take 1 tablet (0.5 mg total) by mouth at bedtime as needed for anxiety. 06/23/19   Nahser, Wonda Cheng, MD  aspirin 81 MG tablet Take 81 mg by mouth daily.      [provider]  cholecalciferol (VITAMIN D) 1000 UNITS tablet Take 1,000 Units by mouth daily.     [provider]  fenofibrate (TRICOR) 145 MG tablet Take 1 tablet (145 mg total) by mouth daily. 03/31/19   Nahser, Wonda Cheng, MD  metoprolol tartrate (LOPRESSOR) 25 MG tablet Take 0.5 tablets (12.5 mg total) by mouth 2 (two) times daily. 03/31/19   Nahser, Wonda Cheng, MD  Multiple Vitamin (MULTIVITAMIN) tablet Take 1 tablet by mouth daily.      [provider]  niacin (NIASPAN) 1000 MG CR tablet Take 1 tablet (1,000 mg total) by mouth at bedtime. 03/31/19   Nahser, Wonda Cheng, MD  omega-3 acid  ethyl esters (LOVAZA) 1 g capsule Take 3 capsules (3 g total) by mouth daily. 09/22/18   Nahser, Wonda Cheng, MD  propranolol (INDERAL) 10 MG tablet Take 1 tablet (10 mg total) by mouth 4 (four) times daily as needed (RAPID HEARTRATE). 06/23/19   Nahser, Wonda Cheng, MD  rosuvastatin (CRESTOR) 10 MG tablet TAKE 1 TABLET DAILY 07/06/19   Nahser, Wonda Cheng, MD  valACYclovir (VALTREX) 1000 MG tablet Take 1 g by mouth 2 (two) times daily as needed. AS NEEDED FOR BREAKOUTS 03/12/16   [provider]  valsartan (DIOVAN) 160 MG tablet Take 1 tablet (160 mg total) by mouth daily. 08/04/19   Nahser, Wonda Cheng, MD    Allergies      Azithromycin, Other, and Bystolic [nebivolol hcl]  Review of Systems   Review of Systems  Cardiovascular: Positive for palpitations. Negative for chest pain.  Musculoskeletal: Negative for back pain.  All other systems reviewed and are negative.   Physical Exam Updated Vital Signs BP 108/67   Pulse 74   Temp 99 F (37.2 C) (Oral)   Resp 12   Ht 6\' 2"  (1.88 m)   Wt 83.9 kg   SpO2 100%   BMI 23.75 kg/m   Physical Exam Vitals and nursing note reviewed.  Constitutional:      General: He is not in acute distress.    Appearance: Normal appearance. He is well-developed.  HENT:     Head: Normocephalic and atraumatic.  Eyes:     Conjunctiva/sclera: Conjunctivae normal.     Pupils: Pupils are equal, round, and reactive to light.  Cardiovascular:     Rate and Rhythm: Normal rate. Rhythm irregular.     Heart sounds: Normal heart sounds.  Pulmonary:     Effort: Pulmonary effort is normal. No respiratory distress.     Breath sounds: Normal breath sounds.  Abdominal:     General: There is no distension.     Palpations: Abdomen is soft.     Tenderness: There is no abdominal tenderness.  Musculoskeletal:        General: No deformity. Normal range of motion.     Cervical back: Normal range of motion and neck supple.  Skin:    General: Skin is warm and dry.  Neurological:     Mental Status: He is alert and oriented to person, place, and time.     ED Results / Procedures / Treatments   Labs (all labs ordered are listed, but only abnormal results are displayed) Labs Reviewed  BASIC METABOLIC PANEL - Abnormal; Notable for the following components:      Result Value   Glucose, Bld 162 (*)    All other components within normal limits  CBC    EKG EKG Interpretation  Date/Time:  Saturday February 19 2020 11:29:02 EDT Ventricular Rate:  72 PR Interval:    QRS Duration: 117 QT Interval:  360 QTC Calculation: 360 R Axis:   102 Text Interpretation: Atrial fibrillation  Incomplete right bundle branch block Confirmed by Dene Gentry [phone]) on 02/19/2020 11:43:06 AM   Radiology DG Chest 2 View  Result Date: 02/19/2020 CLINICAL DATA:  AFib EXAM: CHEST - 2 VIEW COMPARISON:  None. FINDINGS: The heart size and mediastinal contours are within normal limits. Both lungs are clear. Disc degenerative disease of the thoracic spine. IMPRESSION: No acute abnormality of the lungs. Electronically Signed   By: Eddie Candle M.D.   On: 02/19/2020 11:24    Procedures Procedures (including critical care time)  Medications Ordered in ED Medications  metoprolol tartrate (LOPRESSOR) tablet 12.5 mg (12.5 mg Oral Given 02/19/20 1227)    ED Course  I have reviewed the triage vital signs and the nursing notes.  Pertinent labs & imaging results that were available during my care of the patient were reviewed by me and considered in my medical decision making (see chart for details).    MDM Rules/Calculators/A&P                          MDM  Screen complete  EMILEO SEMEL was evaluated in Emergency Department on 02/19/2020 for the symptoms described in the history of present illness. He was evaluated in the context of the global COVID-19 pandemic, which necessitated consideration that the patient might be at risk for infection with the SARS-CoV-2 virus that causes COVID-19. Institutional protocols and algorithms that pertain to the evaluation of patients at risk for COVID-19 are in a state of rapid change based on information released by regulatory bodies including the CDC and federal and state organizations. These policies and algorithms were followed during the patient's care in the ED.  Presenting for evaluation of reported palpitations.  Patient found to be in atrial fibrillation.  Rate controlled in the 70s.  Patient's case discussed with cardiology who will evaluate as consultation.  Patient is been seen by cardiology.  He is appropriate for discharge.  Patient  will be started with Eliquis.  He has a plan in place for cardiology outpatient follow-up and possible cardioversion earlier this week.  He understands plan of care.  Importance of close follow-up is stressed.  Strict return precautions and understood.   Final Clinical Impression(s) / ED Diagnoses Final diagnoses:  Atrial fibrillation, unspecified type ([place])    Rx / DC Orders ED Discharge Orders         Ordered    metoprolol succinate (TOPROL XL) 25 MG 24 hr tablet  Daily,   Status:  Discontinued     Reprint     02/19/20 1527    apixaban (ELIQUIS) 5 MG TABS tablet  2 times daily,   Status:  Discontinued     Reprint     02/19/20 1527    apixaban (ELIQUIS) 5 MG TABS tablet  2 times daily     Discontinue  Reprint     02/19/20 1527    metoprolol succinate (TOPROL XL) 25 MG 24 hr tablet  Daily     Discontinue  Reprint     02/19/20 1529           Valarie Merino, MD 02/19/20 1636

## 2020-02-19 NOTE — Care Management (Signed)
eliquis 30 day free nad 10 dollar a month cards given to patient. Instructed on  Increased bleeding risks

## 2020-02-22 ENCOUNTER — Other Ambulatory Visit: Payer: Self-pay | Admitting: Cardiovascular Disease

## 2020-02-22 ENCOUNTER — Telehealth: Payer: Self-pay

## 2020-02-22 NOTE — Telephone Encounter (Signed)
Called pt to schedule virtual appointment for 02/28/20 at 8 am to touch bases with Dr. Margaretann Loveless prior to TEE/Cardioversion. Left message to call back.

## 2020-02-22 NOTE — Telephone Encounter (Signed)
Pt inquiring if he could move TEE/Cardioversion up due to traveling the day after 8/4. Pt voiced if we can't then he is ok to proceed with plan.   Will route to MD

## 2020-02-22 NOTE — Telephone Encounter (Signed)
Called patient about his Mychart message. Patient complaining of feeling like he was going to pass out about an hour ago while he was driving. Patient is taking metoprolol and valsartan in the morning and now the patient's BP 107/70 HR 82. Informed patient that his BP might have been low and made him feel like that. Encouraged patient to take his metoprolol in the morning and his valsartan later in the day. Encouraged patient to check his BP before taking the valsartan and if it is low to hold the medication and contact our office. Informed patient that he could drink some fluid to get his BP up. Encouraged patient to keep taking his eliquis, to help prevent a stroke. Will forward to Dr. Acie Fredrickson for further advisement.

## 2020-02-22 NOTE — Telephone Encounter (Signed)
Follow Up  Patient is returning a call to [facility]. Please give patient a call back.

## 2020-02-23 NOTE — Telephone Encounter (Signed)
Hi Mr. Tanzi,  I am the on call provider tonight and received your message through our answering service.  Upon reviewing your chart, I see that you left a MyChart message with a similar question.  If your BP is running in the 1-teens currently, you may hold your valsartan tonight.  If you have any further questions tonight, feel free to call back our answering service at [phone].  Have a nice night,  Murray Hodgkins, NP

## 2020-02-24 ENCOUNTER — Telehealth: Payer: Self-pay | Admitting: Nurse Practitioner

## 2020-02-24 NOTE — Telephone Encounter (Signed)
Spoke with patient an he checks his cardio mobile daily and is still in Afib  He does not think virtual visit is necessary Per patient he discussed the option of Dr Margaretann Loveless possibly doing TEE/Cardioversion on Tuesday but did not know if she was able to work that out.  Will forward to Dr Margaretann Loveless for review

## 2020-02-24 NOTE — Telephone Encounter (Signed)
Called patient per request from Dr. Acie Fredrickson to discuss patient's concerns about a fib. We discussed his medication regimen of taking Toprol in the morning and valsartan in the evening which he seems to tolerate well. I answered questions to his satisfaction and gave him ED precautions of significantly elevated HR or BP. He thanked me for the call and will call back with additional questions prior to his cardioversion next week.

## 2020-02-24 NOTE — Telephone Encounter (Signed)
Called the patient to schedule a virtual appointment with Dr. Margaretann Loveless on Monday concerning the TEE/ Cardioversion. The patient wanted to know why the appointment was needed since the procedure is scheduled and he is coming to [facility] for the covid test.

## 2020-02-24 NOTE — Telephone Encounter (Signed)
Just wanted him to be able to check in and ensure he's still in afib before making the long trip. If he does not want to have a visit on Monday that's fine.   I am DOD in the office Tuesday and will not be able to do the procedure earlier than Wednesday. Our major limitation in scheduling is the need for COVID testing which is time sensitive. Please have him plan for the procedure on Wednesday at my earliest or with another doc sooner if he wishes.

## 2020-02-25 NOTE — Telephone Encounter (Signed)
Left message to call back  

## 2020-02-25 NOTE — Telephone Encounter (Signed)
Spoke to pt and advised of MD recommendations. Pt voiced he would like to proceed with TEE/Cardioversion on Wednesday with Dr. Margaretann Loveless. Pt also advised to arrive 3 hours early for rapid COVID test. Pt verbalized understanding.

## 2020-02-28 ENCOUNTER — Other Ambulatory Visit: Payer: Self-pay | Admitting: Cardiovascular Disease

## 2020-02-28 NOTE — Telephone Encounter (Signed)
Pt's pharmacy is requesting a refill on alprazolam. Would Dr. Acie Fredrickson like to refill this medication? Please address

## 2020-03-01 ENCOUNTER — Ambulatory Visit (HOSPITAL_COMMUNITY): Payer: Medicare Other | Admitting: Anesthesiology

## 2020-03-01 ENCOUNTER — Encounter (HOSPITAL_COMMUNITY)
Admission: RE | Disposition: A | Discharge status: Home / Self Care | Payer: Medicare Other | Source: Home / Self Care | Attending: Internal Medicine

## 2020-03-01 ENCOUNTER — Ambulatory Visit (HOSPITAL_COMMUNITY)
Admission: RE | Admit: 2020-03-01 | Discharge: 2020-03-01 | Disposition: A | Discharge status: Home / Self Care | Payer: Medicare Other | Attending: Internal Medicine | Admitting: Internal Medicine

## 2020-03-01 ENCOUNTER — Other Ambulatory Visit: Payer: Self-pay

## 2020-03-01 ENCOUNTER — Telehealth: Payer: Self-pay | Admitting: Internal Medicine

## 2020-03-01 ENCOUNTER — Encounter (HOSPITAL_COMMUNITY): Payer: Self-pay | Admitting: Internal Medicine

## 2020-03-01 ENCOUNTER — Ambulatory Visit (HOSPITAL_BASED_OUTPATIENT_CLINIC_OR_DEPARTMENT_OTHER)
Admission: RE | Admit: 2020-03-01 | Discharge: 2020-03-01 | Disposition: A | Discharge status: Home / Self Care | Payer: Medicare Other | Source: Home / Self Care | Attending: Internal Medicine | Admitting: Internal Medicine

## 2020-03-01 DIAGNOSIS — I493 Ventricular premature depolarization: Secondary | ICD-10-CM | POA: Diagnosis not present

## 2020-03-01 DIAGNOSIS — E785 Hyperlipidemia, unspecified: Secondary | ICD-10-CM | POA: Insufficient documentation

## 2020-03-01 DIAGNOSIS — Z7982 Long term (current) use of aspirin: Secondary | ICD-10-CM | POA: Diagnosis not present

## 2020-03-01 DIAGNOSIS — I4819 Other persistent atrial fibrillation: Secondary | ICD-10-CM | POA: Insufficient documentation

## 2020-03-01 DIAGNOSIS — I4891 Unspecified atrial fibrillation: Secondary | ICD-10-CM

## 2020-03-01 DIAGNOSIS — F419 Anxiety disorder, unspecified: Secondary | ICD-10-CM | POA: Diagnosis not present

## 2020-03-01 DIAGNOSIS — Z79899 Other long term (current) drug therapy: Secondary | ICD-10-CM | POA: Insufficient documentation

## 2020-03-01 DIAGNOSIS — Z881 Allergy status to other antibiotic agents status: Secondary | ICD-10-CM | POA: Diagnosis not present

## 2020-03-01 DIAGNOSIS — I351 Nonrheumatic aortic (valve) insufficiency: Secondary | ICD-10-CM

## 2020-03-01 DIAGNOSIS — Z20822 Contact with and (suspected) exposure to covid-19: Secondary | ICD-10-CM | POA: Diagnosis not present

## 2020-03-01 DIAGNOSIS — I10 Essential (primary) hypertension: Secondary | ICD-10-CM | POA: Insufficient documentation

## 2020-03-01 HISTORY — PX: BUBBLE STUDY: SHX6837

## 2020-03-01 HISTORY — PX: TEE WITHOUT CARDIOVERSION: SHX5443

## 2020-03-01 HISTORY — PX: CARDIOVERSION: SHX1299

## 2020-03-01 LAB — SARS CORONAVIRUS 2 BY RT PCR (HOSPITAL ORDER, PERFORMED IN ~~LOC~~ HOSPITAL LAB): SARS Coronavirus 2: NEGATIVE

## 2020-03-01 SURGERY — ECHOCARDIOGRAM, TRANSESOPHAGEAL
Anesthesia: General

## 2020-03-01 MED ORDER — BUTAMBEN-TETRACAINE-BENZOCAINE 2-2-14 % EX AERO
INHALATION_SPRAY | CUTANEOUS | Status: DC | PRN
  Administered 2020-03-01: 2 via TOPICAL

## 2020-03-01 MED ORDER — SODIUM CHLORIDE 0.9 % IV SOLN: INTRAVENOUS | Status: DC

## 2020-03-01 MED ORDER — APIXABAN 5 MG PO TABS: 5.0000 mg | ORAL_TABLET | Freq: Two times a day (BID) | ORAL | 1 refills | Status: AC

## 2020-03-01 MED ORDER — LIDOCAINE 2% (20 MG/ML) 5 ML SYRINGE
INTRAMUSCULAR | Status: DC | PRN
  Administered 2020-03-01: 100 mg via INTRAVENOUS

## 2020-03-01 MED ORDER — PROPOFOL 10 MG/ML IV BOLUS
INTRAVENOUS | Status: DC | PRN
  Administered 2020-03-01: 20 mg via INTRAVENOUS
  Administered 2020-03-01: 60 mg via INTRAVENOUS
  Administered 2020-03-01: 20 mg via INTRAVENOUS

## 2020-03-01 MED ORDER — PROPOFOL 500 MG/50ML IV EMUL
INTRAVENOUS | Status: DC | PRN
  Administered 2020-03-01: 125 ug/kg/min via INTRAVENOUS

## 2020-03-01 NOTE — Transfer of Care (Signed)
Immediate Anesthesia Transfer of Care Note  Patient: Gregory Hill  Procedure(s) Performed: TRANSESOPHAGEAL ECHOCARDIOGRAM (TEE) (N/A ) CARDIOVERSION (N/A )  Patient Location: Endoscopy Unit  Anesthesia Type:General  Level of Consciousness: drowsy and patient cooperative  Airway & Oxygen Therapy: Patient Spontanous Breathing  Post-op Assessment: Report given to RN and Post -op Vital signs reviewed and stable  Post vital signs: Reviewed and stable  Last Vitals:  Vitals Value Taken Time  BP    Temp    Pulse    Resp    SpO2      Last Pain:  Vitals:   03/01/20 1202  PainSc: 0-No pain         Complications: No complications documented.

## 2020-03-01 NOTE — Anesthesia Preprocedure Evaluation (Addendum)
Anesthesia Evaluation  Patient identified by MRN, date of birth, ID band Patient awake    Reviewed: Allergy & Precautions, NPO status , Patient's Chart, lab work & pertinent test results, reviewed documented beta blocker date and time   Airway Mallampati: II  TM Distance: >3 FB Neck ROM: Full    Dental no notable dental hx. (+) Teeth Intact, Dental Advisory Given   Pulmonary neg pulmonary ROS,    Pulmonary exam normal breath sounds clear to auscultation       Cardiovascular hypertension, Pt. on home beta blockers and Pt. on medications Normal cardiovascular exam+ dysrhythmias (on eliquis) Atrial Fibrillation  Rhythm:Irregular Rate:Normal  HLD  TTE 2017 - Left ventricle: Global LV longitudinal strain is -20.4% The  cavity size was normal. Systolic function was normal. The estimated ejection fraction was in the range of 55% to 60%. Wallmotion was normal; there were no regional wall motion abnormalities.  - Aortic valve: Trileaflet; mildly thickened, mildly calcified  leaflets. There was mild regurgitation.  Stress Test 2016 Nuclear stress EF: 65%. There was no ST segment deviation noted during stress. The study is normal. This is a low risk study. The left ventricular ejection fraction is normal (55-65%).    Neuro/Psych PSYCHIATRIC DISORDERS Anxiety negative neurological ROS     GI/Hepatic negative GI ROS, Neg liver ROS,   Endo/Other  negative endocrine ROS  Renal/GU negative Renal ROS  negative genitourinary   Musculoskeletal negative musculoskeletal ROS (+)   Abdominal   Peds  Hematology negative hematology ROS (+)   Anesthesia Other Findings   Reproductive/Obstetrics                            Anesthesia Physical Anesthesia Plan  ASA: III  Anesthesia Plan: General   Post-op Pain Management:    Induction: Intravenous  PONV Risk Score and Plan: 2 and Propofol  infusion and Treatment may vary due to age or medical condition  Airway Management Planned: Natural Airway  Additional Equipment:   Intra-op Plan:   Post-operative Plan:   Informed Consent: I have reviewed the patients History and Physical, chart, labs and discussed the procedure including the risks, benefits and alternatives for the proposed anesthesia with the patient or authorized representative who has indicated his/her understanding and acceptance.     Dental advisory given  Plan Discussed with: CRNA  Anesthesia Plan Comments:         Anesthesia Quick Evaluation

## 2020-03-01 NOTE — Anesthesia Postprocedure Evaluation (Signed)
Anesthesia Post Note  Patient: Gregory Hill  Procedure(s) Performed: TRANSESOPHAGEAL ECHOCARDIOGRAM (TEE) (N/A ) CARDIOVERSION (N/A ) BUBBLE STUDY     Patient location during evaluation: Endoscopy Anesthesia Type: General Level of consciousness: awake and alert Pain management: pain level controlled Vital Signs Assessment: post-procedure vital signs reviewed and stable Respiratory status: spontaneous breathing, nonlabored ventilation, respiratory function stable and patient connected to nasal cannula oxygen Cardiovascular status: blood pressure returned to baseline and stable Postop Assessment: no apparent nausea or vomiting Anesthetic complications: no   No complications documented.  Last Vitals:  Vitals:   03/01/20 1405 03/01/20 1417  BP: 109/65 110/60  Pulse: 69 66  Resp: 20 16  SpO2: 95% 100%    Last Pain:  Vitals:   03/01/20 1417  PainSc: 0-No pain                 Rilynn Habel L Sydnie Sigmund

## 2020-03-01 NOTE — Discharge Instructions (Signed)
Transesophageal Echocardiogram Transesophageal echocardiogram (TEE) is a test that uses sound waves to take pictures of your heart. TEE is done by passing a flexible tube down the esophagus. The esophagus is the tube that carries food from the throat to the stomach. The pictures give detailed images of your heart. This can help your doctor see if there are problems with your heart. What happens before the procedure? Staying hydrated Follow instructions from your doctor about hydration, which may include:  Up to 3 hours before the procedure - you may continue to drink clear liquids, such as: ? Water. ? Clear fruit juice. ? Black coffee. ? Plain tea.  Eating and drinking Follow instructions from your doctor about eating and drinking, which may include:  8 hours before the procedure - stop eating heavy meals or foods such as meat, fried foods, or fatty foods.  6 hours before the procedure - stop eating light meals or foods, such as toast or cereal.  6 hours before the procedure - stop drinking milk or drinks that contain milk.  3 hours before the procedure - stop drinking clear liquids. General instructions  You will need to take out any dentures or retainers.  Plan to have someone take you home from the hospital or clinic.  If you will be going home right after the procedure, plan to have someone with you for 24 hours.  Ask your doctor about: ? Changing or stopping your normal medicines. This is important if you take diabetes medicines or blood thinners. ? Taking over-the-counter medicines, vitamins, herbs, and supplements. ? Taking medicines such as aspirin and ibuprofen. These medicines can thin your blood. Do not take these medicines unless your doctor tells you to take them. What happens during the procedure?  To lower your risk of infection, your doctors will wash or clean their hands.  An IV will be put into one of your veins.  You will be given a medicine to help you  relax (sedative).  A medicine may be sprayed or gargled. This numbs the back of your throat.  Your blood pressure, heart rate, and breathing will be watched.  You may be asked to lay on your left side.  A bite block will be placed in your mouth. This keeps you from biting the tube.  The tip of the TEE probe will be placed into the back of your mouth.  You will be asked to swallow.  Your doctor will take pictures of your heart.  The probe and bite block will be taken out. The procedure may vary among doctors and hospitals. What happens after the procedure?   Your blood pressure, heart rate, breathing rate, and blood oxygen level will be watched until the medicines you were given have worn off.  When you first wake up, your throat may feel sore and numb. This will get better over time. You will not be allowed to eat or drink until the numbness has gone away.  Do not drive for 24 hours if you were given a medicine to help you relax. Summary  TEE is a test that uses sound waves to take pictures of your heart.  You will be given a medicine to help you relax.  Do not drive for 24 hours if you were given a medicine to help you relax. This information is not intended to replace advice given to you by your health care provider. Make sure you discuss any questions you have with your health care provider. Document Revised:   04/03/2018 Document Reviewed: 10/16/2016 Elsevier Patient Education  [place]. Hospital doctor cardioversion is the delivery of a jolt of electricity to restore a normal rhythm to the heart. A rhythm that is too fast or is not regular keeps the heart from pumping well. In this procedure, sticky patches or metal paddles are placed on the chest to deliver electricity to the heart from a device. This procedure may be done in an emergency if:  There is low or no blood pressure as a result of the heart rhythm.  Normal rhythm must be restored  as fast as possible to protect the brain and heart from further damage.  It may save a life. This may also be a scheduled procedure for irregular or fast heart rhythms that are not immediately life-threatening. Tell a health care provider about:  Any allergies you have.  All medicines you are taking, including vitamins, herbs, eye drops, creams, and over-the-counter medicines.  Any problems you or family members have had with anesthetic medicines.  Any blood disorders you have.  Any surgeries you have had.  Any medical conditions you have.  Whether you are pregnant or may be pregnant. What are the risks? Generally, this is a safe procedure. However, problems may occur, including:  Allergic reactions to medicines.  A blood clot that breaks free and travels to other parts of your body.  The possible return of an abnormal heart rhythm within hours or days after the procedure.  Your heart stopping (cardiac arrest). This is rare. What happens before the procedure? Medicines  Your health care provider may have you start taking: ? Blood-thinning medicines (anticoagulants) so your blood does not clot as easily. ? Medicines to help stabilize your heart rate and rhythm.  Ask your health care provider about: ? Changing or stopping your regular medicines. This is especially important if you are taking diabetes medicines or blood thinners. ? Taking medicines such as aspirin and ibuprofen. These medicines can thin your blood. Do not take these medicines unless your health care provider tells you to take them. ? Taking over-the-counter medicines, vitamins, herbs, and supplements. General instructions  Follow instructions from your health care provider about eating or drinking restrictions.  Plan to have someone take you home from the hospital or clinic.  If you will be going home right after the procedure, plan to have someone with you for 24 hours.  Ask your health care provider  what steps will be taken to help prevent infection. These may include washing your skin with a germ-killing soap. What happens during the procedure?   An IV will be inserted into one of your veins.  Sticky patches (electrodes) or metal paddles may be placed on your chest.  You will be given a medicine to help you relax (sedative).  An electrical shock will be delivered. The procedure may vary among health care providers and hospitals. What can I expect after the procedure?  Your blood pressure, heart rate, breathing rate, and blood oxygen level will be monitored until you leave the hospital or clinic.  Your heart rhythm will be watched to make sure it does not change.  You may have some redness on the skin where the shocks were given. Follow these instructions at home:  Do not drive for 24 hours if you were given a sedative during your procedure.  Take over-the-counter and prescription medicines only as told by your health care provider.  Ask your health care provider how to check your pulse.  your pulse. Check it often.  Rest for 48 hours after the procedure or as told by your health care provider.  Avoid or limit your caffeine use as told by your health care provider.  Keep all follow-up visits as told by your health care provider. This is important. Contact a health care provider if:  You feel like your heart is beating too quickly or your pulse is not regular.  You have a serious muscle cramp that does not go away. Get help right away if:  You have discomfort in your chest.  You are dizzy or you feel faint.  You have trouble breathing or you are short of breath.  Your speech is slurred.  You have trouble moving an arm or leg on one side of your body.  Your fingers or toes turn cold or blue. Summary  Electrical cardioversion is the delivery of a jolt of electricity to restore a normal rhythm to the heart.  This procedure may be done right away in an emergency or  may be a scheduled procedure if the condition is not an emergency.  Generally, this is a safe procedure.  After the procedure, check your pulse often as told by your health care provider. This information is not intended to replace advice given to you by your health care provider. Make sure you discuss any questions you have with your health care provider. Document Revised: 02/15/2019 Document Reviewed: 02/15/2019 Elsevier Patient Education  2020 Elsevier Inc.   

## 2020-03-01 NOTE — Interval H&P Note (Signed)
History and Physical Interval Note:  03/01/2020 12:47 PM  Gregory Hill  has presented today for surgery, with the diagnosis of afib.  The various methods of treatment have been discussed with the patient and family. After consideration of risks, benefits and other options for treatment, the patient has consented to  Procedure(s): TRANSESOPHAGEAL ECHOCARDIOGRAM (TEE) (N/A) CARDIOVERSION (N/A) as a surgical intervention.  The patient's history has been reviewed, patient examined, no change in status, stable for surgery.  I have reviewed the patient's chart and labs.  Questions were answered to the patient's satisfaction.     Elouise Munroe

## 2020-03-01 NOTE — Telephone Encounter (Signed)
Called for persistent soreness.  Briefly, 69 yo M recent TEE/DCCV for A fib.  Still on AC.  Patient noted cramping after procedure.  Presently, patient notes R leg soreness (described a the sorness one gets after a [employer]).  Not worse, but not better.  No other muscle sites with discomfort.  No leg discoloration.    No problems with anesthesia prior.  Has IM follow up 03/07/20.    Discussed red flag symptoms requiring ED Eval including increased pain or no improvement in sx.  At present, reasonable for watch and wait strategy. No LAA thrombus and on [facility]; current presentation less consistent with arterial thrombus, but will monitor the situation.  No allergies to anesthesia/propfol.  Patient denied further questions.  Rudean Haskell MD

## 2020-03-01 NOTE — Progress Notes (Signed)
Echocardiogram Echocardiogram Transesophageal has been performed.  Oneal Deputy Rami Waddle 03/01/2020, 2:32 PM

## 2020-03-01 NOTE — CV Procedure (Signed)
INDICATIONS: afib, pre cardioversion rule out LAA thrombus  PROCEDURE:   Informed consent was obtained prior to the procedure. The risks, benefits and alternatives for the procedure were discussed and the patient comprehended these risks.  Risks include, but are not limited to, cough, sore throat, vomiting, nausea, somnolence, esophageal and stomach trauma or perforation, bleeding, low blood pressure, aspiration, pneumonia, infection, trauma to the teeth and death.    After a procedural time-out, the oropharynx was anesthetized with 20% benzocaine spray.   During this procedure the patient was administered propofol per anesthesia Erlene Quan CRNA, Dr. Lanetta Inch anesthesia).  The patient's heart rate, blood pressure, and oxygen saturation were monitored continuously during the procedure. The period of conscious sedation was 45 minutes, of which I was present face-to-face 100% of this time.  The transesophageal probe was inserted in the esophagus and stomach without difficulty and multiple views were obtained.  The patient was kept under observation until the patient left the procedure room.  The patient left the procedure room in stable condition.   Agitated microbubble saline contrast was administered.  COMPLICATIONS:    There were no immediate complications.  FINDINGS:  No LA appendage thrombus No LV apical thrombus.  Normal EF 60-65%.  Mild-moderate aortic valve regurgitation, tricuspid aortic valve.  No MR, Trivial TR, PR. PFO present with small left to right shunt, no right to left shunt noted with agitated saline.  RECOMMENDATIONS:    Proceed to cardioversion  Time Spent Directly with the Patient:  45 minutes   Elouise Munroe 03/01/2020, 2:11 PM  Procedure: Electrical Cardioversion Indications:  Atrial Fibrillation  Procedure Details:  Consent: Risks of procedure as well as the alternatives and risks of each were explained to the patient.  Consent for procedure  obtained.  Time Out: Verified patient identification, verified procedure, site/side was marked, verified correct patient position, special equipment/implants available, medications/allergies/relevent history reviewed, required imaging and test results available. PERFORMED.  Patient placed on cardiac monitor, pulse oximetry, supplemental oxygen as necessary.  Sedation given: propofol per anesthesia Pacer pads placed anterior and posterior chest.  Cardioverted 1 time(s).  Cardioversion with synchronized biphasic 120J shock.  Evaluation: Findings: Post procedure EKG shows: NSR Complications: None Patient did tolerate procedure well.  Time Spent Directly with the Patient:  20 minutes   Elouise Munroe 03/01/2020, 2:11 PM

## 2020-03-02 ENCOUNTER — Telehealth (INDEPENDENT_AMBULATORY_CARE_PROVIDER_SITE_OTHER): Payer: Medicare Other | Admitting: Internal Medicine

## 2020-03-02 ENCOUNTER — Telehealth: Payer: Self-pay | Admitting: *Deleted

## 2020-03-02 ENCOUNTER — Telehealth: Payer: Medicare Other | Admitting: Internal Medicine

## 2020-03-02 ENCOUNTER — Encounter: Payer: Self-pay | Admitting: Internal Medicine

## 2020-03-02 VITALS — Ht 74.0 in

## 2020-03-02 DIAGNOSIS — I4819 Other persistent atrial fibrillation: Secondary | ICD-10-CM | POA: Diagnosis not present

## 2020-03-02 DIAGNOSIS — I10 Essential (primary) hypertension: Secondary | ICD-10-CM

## 2020-03-02 NOTE — Patient Instructions (Addendum)
  Medication Instructions:  No changes to medications. A  Refill prescription was  sent  for eliuqis for 1 mo x 1 refill yesterday to [facility] and he should have next follow up with his cardiologist in [facility]   *If you need a refill on your cardiac medications before your next appointment, please call your pharmacy*   Lab Work: Not needed   Testing/Procedures: Not need   Follow-Up: At [facility], you and your health needs are our priority.  As part of our continuing mission to provide you with exceptional heart care, we have created designated Provider Care Teams.  These Care Teams include your primary Cardiologist (physician) and Advanced Practice Providers (APPs -  Physician Assistants and Nurse Practitioners) who all work together to provide you with the care you need, when you need it.  We recommend signing up for the patient portal called "MyChart".  Sign up information is provided on this After Visit Summary.  MyChart is used to connect with patients for Virtual Visits (Telemedicine).  Patients are able to view lab/test results, encounter notes, upcoming appointments, etc.  Non-urgent messages can be sent to your provider as well.   To learn more about what you can do with MyChart, go to [email or web address].    Your next appointment:    as  needed will follow upt with [facility] cardiolgist  The format for your next appointment:    person or virtual  Provider:   You may see Dr Margaretann Loveless or one of the following Advanced Practice Providers on your designated Care Team:    Rosaria Ferries, PA-C  Jory Sims, DNP, ANP  Cadence Kathlen Mody, PA-C    Other Instructions

## 2020-03-02 NOTE — Telephone Encounter (Signed)
  Patient Consent for Virtual Visit         Gregory Hill has provided verbal consent on 03/02/2020 for a virtual visit (video or telephone).   CONSENT FOR VIRTUAL VISIT FOR:  Gregory Hill  By participating in this virtual visit I agree to the following:  I hereby voluntarily request, consent and authorize [facility] and its employed or contracted physicians, physician assistants, nurse practitioners or other licensed health care professionals (the Practitioner), to provide me with telemedicine health care services (the "Services") as deemed necessary by the treating Practitioner. I acknowledge and consent to receive the Services by the Practitioner via telemedicine. I understand that the telemedicine visit will involve communicating with the Practitioner through live audiovisual communication technology and the disclosure of certain medical information by electronic transmission. I acknowledge that I have been given the opportunity to request an in-person assessment or other available alternative prior to the telemedicine visit and am voluntarily participating in the telemedicine visit.  I understand that I have the right to withhold or withdraw my consent to the use of telemedicine in the course of my care at any time, without affecting my right to future care or treatment, and that the Practitioner or I may terminate the telemedicine visit at any time. I understand that I have the right to inspect all information obtained and/or recorded in the course of the telemedicine visit and may receive copies of available information for a reasonable fee.  I understand that some of the potential risks of receiving the Services via telemedicine include:  Marland Kitchen Delay or interruption in medical evaluation due to technological equipment failure or disruption; . Information transmitted may not be sufficient (e.g. poor resolution of images) to allow for appropriate medical decision making by the  Practitioner; and/or  . In rare instances, security protocols could fail, causing a breach of personal health information.  Furthermore, I acknowledge that it is my responsibility to provide information about my medical history, conditions and care that is complete and accurate to the best of my ability. I acknowledge that Practitioner's advice, recommendations, and/or decision may be based on factors not within their control, such as incomplete or inaccurate data provided by me or distortions of diagnostic images or specimens that may result from electronic transmissions. I understand that the practice of medicine is not an exact science and that Practitioner makes no warranties or guarantees regarding treatment outcomes. I acknowledge that a copy of this consent can be made available to me via my patient portal ([place]), or I can request a printed copy by calling the office of [facility].    I understand that my insurance will be billed for this visit.   I have read or had this consent read to me. . I understand the contents of this consent, which adequately explains the benefits and risks of the Services being provided via telemedicine.  . I have been provided ample opportunity to ask questions regarding this consent and the Services and have had my questions answered to my satisfaction. . I give my informed consent for the services to be provided through the use of telemedicine in my medical care

## 2020-03-02 NOTE — Progress Notes (Signed)
Virtual Visit via Telephone Note   This visit type was conducted due to national recommendations for restrictions regarding the COVID-19 Pandemic (e.g. social distancing) in an effort to limit this patient's exposure and mitigate transmission in our community.  Due to his co-morbid illnesses, this patient is at least at moderate risk for complications without adequate follow up.  This format is felt to be most appropriate for this patient at this time.  The patient did not have access to video technology/had technical difficulties with video requiring transitioning to audio format only (telephone).  All issues noted in this document were discussed and addressed.  No physical exam could be performed with this format.  Please refer to the patient's chart for his  consent to telehealth for [facility].   Date:  03/02/2020   ID:  Gregory, Hill 04-14-1951, MRN [id number] The patient was identified using 2 identifiers.  Patient Location: Home Provider Location: Home Office  PCP:  Prince Solian, MD  Cardiologist:  Mertie Moores, MD  Electrophysiologist:  None   Evaluation Performed:  Follow-Up Visit  Chief Complaint:  F/u TEE DCCV  History of Present Illness:    Gregory Hill is a 69 y.o. male with paroxysmal Afib, with TEE/DCCV by me yesterday. Patient requested follow up. Hx includes one previous episode of atrial fibrillation in 2008, which he recalls involved cardiac arrest involving CPR, and cardioversion during his admission.    He called into the after-hours line last evening after his procedure with right leg soreness. Spoke to my partner Dr. Gasper Sells. Denied blue toe or leg discoloration. We discussed that his TEE and cardioversion were uneventful and there was no evidence of left atrial appendage thrombus on meticulous evaluation on TEE. We discussed that this could be positional from the procedure, and to monitor it closely.  Otherwise he is doing well and  is maintaining sinus rhythm. Medications reviewed in detail.   The patient does not have symptoms concerning for COVID-19 infection (fever, chills, cough, or new shortness of breath).    Past Medical History:  Diagnosis Date  . Anxiety   . Atrial fibrillation ([place])   . Hyperlipidemia   . Hypertension   . PVC (premature ventricular contraction)    Past Surgical History:  Procedure Laterality Date  . CARDIOVERSION  2008     Current Meds  Medication Sig  . ALPRAZolam (XANAX) 0.5 MG tablet TAKE 1 TABLET (0.5 MG TOTAL) BY MOUTH 2 (TWO) TIMES DAILY AS NEEDED FOR ANXIETY.  Marland Kitchen apixaban (ELIQUIS) 5 MG TABS tablet Take 1 tablet (5 mg total) by mouth 2 (two) times daily.  . Cholecalciferol (VITAMIN D-3) 25 MCG (1000 UT) CAPS Take 1,000 Units by mouth daily.  . fenofibrate (TRICOR) 145 MG tablet TAKE 1 TABLET DAILY  . Multiple Vitamin (MULTIVITAMIN) tablet Take 1 tablet by mouth daily.    . propranolol (INDERAL) 10 MG tablet Take 1 tablet (10 mg total) by mouth 4 (four) times daily as needed (RAPID HEARTRATE).  Marland Kitchen rosuvastatin (CRESTOR) 10 MG tablet TAKE 1 TABLET DAILY (Patient taking differently: Take 10 mg by mouth at bedtime. )  . valACYclovir (VALTREX) 1000 MG tablet Take 1 g by mouth 2 (two) times daily as needed. AS NEEDED FOR BREAKOUTS  . valsartan (DIOVAN) 160 MG tablet Take 1 tablet (160 mg total) by mouth daily.     Allergies:   Erythromycin and Bystolic [nebivolol hcl]   Social History   Tobacco Use  . Smoking status: Never Smoker  .  Smokeless tobacco: Never Used  Substance Use Topics  . Alcohol use: Yes    Alcohol/week: 3.0 - 5.0 standard drinks    Types: 3 - 5 drink(s) per week  . Drug use: No     Family Hx: The patient's family history includes Heart attack in his mother; Heart disease in his mother.  ROS:   Please see the history of present illness.     All other systems reviewed and are negative.   Prior CV studies:   The following studies were reviewed  today:    Labs/Other Tests and Data Reviewed:    EKG:  No ECG reviewed.  Recent Labs: 03/31/2019: ALT 21; TSH 3.480 02/19/2020: BUN 15; Creatinine, Ser 1.01; Hemoglobin 13.5; Platelets 205; Potassium 3.7; Sodium 140   Recent Lipid Panel Lab Results  Component Value Date/Time   CHOL 108 03/31/2019 08:43 AM   TRIG 73 03/31/2019 08:43 AM   TRIG 79 05/07/2017 09:12 AM   HDL 29 (L) 03/31/2019 08:43 AM   HDL 26 (L) 05/07/2017 09:12 AM   CHOLHDL 3.7 03/31/2019 08:43 AM   CHOLHDL 3.7 11/22/2015 09:48 AM   LDLCALC 64 03/31/2019 08:43 AM    Wt Readings from Last 3 Encounters:  02/19/20 185 lb (83.9 kg)  03/31/19 184 lb 1.9 oz (83.5 kg)  09/22/18 188 lb 12.8 oz (85.6 kg)     Objective:    Vital Signs:  Ht 6\' 2"  (1.88 m)   BMI 23.75 kg/m    VITAL SIGNS:  reviewed GEN:  no acute distress RESPIRATORY:  normal respiratory effort, no increased work of breathing NEURO:  alert and oriented x 3, speech normal PSYCH:  normal affect   ASSESSMENT & PLAN:    Persistent atrial fibrillation (HCC)  Essential hypertension   He will need to continue anticoagulation for 4 weeks post cardioversion, and I would recommend continuing afterward as well for CHA2DS2-VASc score of at least 2 (age and hypertension). He has been tolerating apixaban 5 mg twice daily well.  He has propranolol that he can take as needed for palpitations.  Can continue valsartan for hypertension, BP with good control.  Continue Crestor 10 mg daily as well.   COVID-19 Education: The signs and symptoms of COVID-19 were discussed with the patient and how to seek care for testing (follow up with PCP or arrange E-visit).  The importance of social distancing was discussed today.  Time:   Today, I have spent 11 minutes with the patient with telehealth technology discussing the above problems.     Medication Adjustments/Labs and Tests Ordered: Current medicines are reviewed at length with the patient today.  Concerns  regarding medicines are outlined above.   Tests Ordered: No orders of the defined types were placed in this encounter.   Medication Changes: No orders of the defined types were placed in this encounter.   Follow Up:  prn  Signed, Elouise Munroe, MD  03/02/2020 8:44 AM    [place]

## 2020-03-07 DIAGNOSIS — E785 Hyperlipidemia, unspecified: Secondary | ICD-10-CM | POA: Diagnosis not present

## 2020-03-07 DIAGNOSIS — I480 Paroxysmal atrial fibrillation: Secondary | ICD-10-CM | POA: Diagnosis not present

## 2020-03-10 DIAGNOSIS — I480 Paroxysmal atrial fibrillation: Secondary | ICD-10-CM | POA: Diagnosis not present

## 2020-03-10 DIAGNOSIS — I10 Essential (primary) hypertension: Secondary | ICD-10-CM | POA: Diagnosis not present

## 2020-03-10 DIAGNOSIS — E785 Hyperlipidemia, unspecified: Secondary | ICD-10-CM | POA: Diagnosis not present

## 2020-03-16 DIAGNOSIS — Q211 Atrial septal defect: Secondary | ICD-10-CM | POA: Diagnosis not present

## 2020-03-16 DIAGNOSIS — E785 Hyperlipidemia, unspecified: Secondary | ICD-10-CM | POA: Diagnosis not present

## 2020-03-16 DIAGNOSIS — I480 Paroxysmal atrial fibrillation: Secondary | ICD-10-CM | POA: Diagnosis not present

## 2020-03-16 DIAGNOSIS — I493 Ventricular premature depolarization: Secondary | ICD-10-CM | POA: Diagnosis not present

## 2020-03-16 DIAGNOSIS — I10 Essential (primary) hypertension: Secondary | ICD-10-CM | POA: Diagnosis not present

## 2020-03-27 DIAGNOSIS — I10 Essential (primary) hypertension: Secondary | ICD-10-CM | POA: Diagnosis not present

## 2020-03-27 DIAGNOSIS — E785 Hyperlipidemia, unspecified: Secondary | ICD-10-CM | POA: Diagnosis not present

## 2020-03-27 DIAGNOSIS — I480 Paroxysmal atrial fibrillation: Secondary | ICD-10-CM | POA: Diagnosis not present

## 2020-03-27 DIAGNOSIS — I493 Ventricular premature depolarization: Secondary | ICD-10-CM | POA: Diagnosis not present

## 2020-04-05 ENCOUNTER — Other Ambulatory Visit: Payer: Self-pay

## 2020-04-05 MED ORDER — FENOFIBRATE 145 MG PO TABS: 145.0000 mg | ORAL_TABLET | Freq: Every day | ORAL | 0 refills | Status: AC

## 2020-04-24 DIAGNOSIS — Z23 Encounter for immunization: Secondary | ICD-10-CM | POA: Diagnosis not present

## 2020-04-25 DIAGNOSIS — I480 Paroxysmal atrial fibrillation: Secondary | ICD-10-CM | POA: Diagnosis not present

## 2020-04-25 DIAGNOSIS — I10 Essential (primary) hypertension: Secondary | ICD-10-CM | POA: Diagnosis not present

## 2020-04-25 DIAGNOSIS — I493 Ventricular premature depolarization: Secondary | ICD-10-CM | POA: Diagnosis not present

## 2020-04-25 DIAGNOSIS — E782 Mixed hyperlipidemia: Secondary | ICD-10-CM | POA: Diagnosis not present

## 2020-04-27 DIAGNOSIS — I480 Paroxysmal atrial fibrillation: Secondary | ICD-10-CM | POA: Diagnosis not present

## 2020-04-27 DIAGNOSIS — I493 Ventricular premature depolarization: Secondary | ICD-10-CM | POA: Diagnosis not present

## 2020-05-02 DIAGNOSIS — I480 Paroxysmal atrial fibrillation: Secondary | ICD-10-CM | POA: Diagnosis not present

## 2020-05-02 DIAGNOSIS — I493 Ventricular premature depolarization: Secondary | ICD-10-CM | POA: Diagnosis not present

## 2020-05-17 DIAGNOSIS — Z23 Encounter for immunization: Secondary | ICD-10-CM | POA: Diagnosis not present

## 2020-05-23 ENCOUNTER — Other Ambulatory Visit: Payer: Self-pay | Admitting: Cardiovascular Disease

## 2020-06-12 DIAGNOSIS — I480 Paroxysmal atrial fibrillation: Secondary | ICD-10-CM | POA: Diagnosis not present

## 2020-06-17 ENCOUNTER — Other Ambulatory Visit: Payer: Self-pay | Admitting: Cardiovascular Disease

## 2020-06-19 DIAGNOSIS — I10 Essential (primary) hypertension: Secondary | ICD-10-CM | POA: Diagnosis not present

## 2020-06-19 DIAGNOSIS — I480 Paroxysmal atrial fibrillation: Secondary | ICD-10-CM | POA: Diagnosis not present

## 2020-06-19 DIAGNOSIS — E785 Hyperlipidemia, unspecified: Secondary | ICD-10-CM | POA: Diagnosis not present

## 2020-06-19 DIAGNOSIS — I493 Ventricular premature depolarization: Secondary | ICD-10-CM | POA: Diagnosis not present
